# Patient Record
Sex: Male | Born: 1945 | Race: Black or African American | Hispanic: No | Smoking: Former smoker
Health system: Southern US, Community
[De-identification: ages and names within clinical notes are randomized; demographics above are authoritative.]

## PROBLEM LIST (undated history)

## (undated) DIAGNOSIS — R413 Other amnesia: Secondary | ICD-10-CM

## (undated) DIAGNOSIS — C61 Malignant neoplasm of prostate: Secondary | ICD-10-CM

## (undated) DIAGNOSIS — I77819 Aortic ectasia, unspecified site: Secondary | ICD-10-CM

## (undated) DIAGNOSIS — E0590 Thyrotoxicosis, unspecified without thyrotoxic crisis or storm: Secondary | ICD-10-CM

## (undated) DIAGNOSIS — F0390 Unspecified dementia without behavioral disturbance: Secondary | ICD-10-CM

## (undated) DIAGNOSIS — F329 Major depressive disorder, single episode, unspecified: Secondary | ICD-10-CM

## (undated) DIAGNOSIS — K5900 Constipation, unspecified: Secondary | ICD-10-CM

## (undated) DIAGNOSIS — J309 Allergic rhinitis, unspecified: Secondary | ICD-10-CM

## (undated) DIAGNOSIS — Z860100 Personal history of colon polyps, unspecified: Secondary | ICD-10-CM

## (undated) DIAGNOSIS — I10 Essential (primary) hypertension: Secondary | ICD-10-CM

## (undated) DIAGNOSIS — N200 Calculus of kidney: Secondary | ICD-10-CM

## (undated) DIAGNOSIS — E559 Vitamin D deficiency, unspecified: Secondary | ICD-10-CM

## (undated) DIAGNOSIS — K861 Other chronic pancreatitis: Secondary | ICD-10-CM

## (undated) DIAGNOSIS — T7840XA Allergy, unspecified, initial encounter: Secondary | ICD-10-CM

## (undated) DIAGNOSIS — F32A Depression, unspecified: Secondary | ICD-10-CM

## (undated) HISTORY — DX: Vitamin D deficiency, unspecified: E55.9

## (undated) HISTORY — DX: Aortic ectasia, unspecified site: I77.819

## (undated) HISTORY — PX: OTHER SURGICAL HISTORY: SHX169

## (undated) HISTORY — DX: Thyrotoxicosis, unspecified without thyrotoxic crisis or storm: E05.90

## (undated) HISTORY — DX: Unspecified dementia, unspecified severity, without behavioral disturbance, psychotic disturbance, mood disturbance, and anxiety: F03.90

## (undated) HISTORY — DX: Depression, unspecified: F32.A

## (undated) HISTORY — DX: Personal history of colonic polyps: Z86.010

## (undated) HISTORY — DX: Other amnesia: R41.3

## (undated) HISTORY — DX: Allergy, unspecified, initial encounter: T78.40XA

## (undated) HISTORY — DX: Allergic rhinitis, unspecified: J30.9

## (undated) HISTORY — DX: Constipation, unspecified: K59.00

## (undated) HISTORY — DX: Other chronic pancreatitis: K86.1

## (undated) HISTORY — DX: Major depressive disorder, single episode, unspecified: F32.9

## (undated) HISTORY — DX: Essential (primary) hypertension: I10

## (undated) HISTORY — DX: Calculus of kidney: N20.0

## (undated) HISTORY — DX: Personal history of colon polyps, unspecified: Z86.0100

## (undated) HISTORY — DX: Malignant neoplasm of prostate: C61

---

## 1988-05-18 HISTORY — PX: OTHER SURGICAL HISTORY: SHX169

## 2003-05-19 DIAGNOSIS — C61 Malignant neoplasm of prostate: Secondary | ICD-10-CM

## 2003-05-19 HISTORY — DX: Malignant neoplasm of prostate: C61

## 2003-05-19 HISTORY — PX: OTHER SURGICAL HISTORY: SHX169

## 2007-10-15 ENCOUNTER — Emergency Department (HOSPITAL_COMMUNITY): Admission: EM | Admit: 2007-10-15 | Discharge: 2007-10-15 | Payer: Self-pay | Admitting: Emergency Medicine

## 2007-10-24 ENCOUNTER — Emergency Department (HOSPITAL_COMMUNITY): Admission: EM | Admit: 2007-10-24 | Discharge: 2007-10-24 | Payer: Self-pay | Admitting: Emergency Medicine

## 2007-11-25 ENCOUNTER — Ambulatory Visit: Payer: Self-pay | Admitting: Internal Medicine

## 2007-11-25 DIAGNOSIS — Z860100 Personal history of colon polyps, unspecified: Secondary | ICD-10-CM | POA: Insufficient documentation

## 2007-11-25 DIAGNOSIS — Z8546 Personal history of malignant neoplasm of prostate: Secondary | ICD-10-CM | POA: Insufficient documentation

## 2007-11-25 DIAGNOSIS — I10 Essential (primary) hypertension: Secondary | ICD-10-CM | POA: Insufficient documentation

## 2007-11-25 DIAGNOSIS — J309 Allergic rhinitis, unspecified: Secondary | ICD-10-CM | POA: Insufficient documentation

## 2007-11-25 DIAGNOSIS — M1990 Unspecified osteoarthritis, unspecified site: Secondary | ICD-10-CM | POA: Insufficient documentation

## 2008-07-27 ENCOUNTER — Ambulatory Visit: Payer: Self-pay | Admitting: Internal Medicine

## 2008-08-13 ENCOUNTER — Ambulatory Visit: Payer: Self-pay | Admitting: Internal Medicine

## 2011-02-12 LAB — POCT CARDIAC MARKERS
CKMB, poc: 2
Myoglobin, poc: 102
Operator id: 265201
Troponin i, poc: <0.05

## 2011-02-12 LAB — CBC
MCHC: 34.2
RBC: 4.76
RDW: 14.3

## 2011-02-12 LAB — DIFFERENTIAL
Basophils Absolute: 0.1
Basophils Relative: 2 — ABNORMAL HIGH
Monocytes Relative: 4
Neutro Abs: 3.9
Neutrophils Relative %: 64

## 2011-02-12 LAB — POCT I-STAT, CHEM 8
Chloride: 105
HCT: 48
Potassium: 4.2
Sodium: 141

## 2011-05-21 DIAGNOSIS — R319 Hematuria, unspecified: Secondary | ICD-10-CM | POA: Diagnosis not present

## 2011-05-21 DIAGNOSIS — N529 Male erectile dysfunction, unspecified: Secondary | ICD-10-CM | POA: Diagnosis not present

## 2011-05-21 DIAGNOSIS — C61 Malignant neoplasm of prostate: Secondary | ICD-10-CM | POA: Diagnosis not present

## 2011-07-23 DIAGNOSIS — C61 Malignant neoplasm of prostate: Secondary | ICD-10-CM | POA: Diagnosis not present

## 2011-09-04 DIAGNOSIS — R319 Hematuria, unspecified: Secondary | ICD-10-CM | POA: Diagnosis not present

## 2011-09-04 DIAGNOSIS — N529 Male erectile dysfunction, unspecified: Secondary | ICD-10-CM | POA: Diagnosis not present

## 2011-09-04 DIAGNOSIS — C61 Malignant neoplasm of prostate: Secondary | ICD-10-CM | POA: Diagnosis not present

## 2011-09-04 DIAGNOSIS — Z8546 Personal history of malignant neoplasm of prostate: Secondary | ICD-10-CM | POA: Diagnosis not present

## 2011-09-04 DIAGNOSIS — D414 Neoplasm of uncertain behavior of bladder: Secondary | ICD-10-CM | POA: Diagnosis not present

## 2012-02-24 DIAGNOSIS — C61 Malignant neoplasm of prostate: Secondary | ICD-10-CM | POA: Diagnosis not present

## 2012-02-24 DIAGNOSIS — D414 Neoplasm of uncertain behavior of bladder: Secondary | ICD-10-CM | POA: Diagnosis not present

## 2012-06-07 DIAGNOSIS — C61 Malignant neoplasm of prostate: Secondary | ICD-10-CM | POA: Diagnosis not present

## 2012-06-14 DIAGNOSIS — D414 Neoplasm of uncertain behavior of bladder: Secondary | ICD-10-CM | POA: Diagnosis not present

## 2013-04-17 DIAGNOSIS — C61 Malignant neoplasm of prostate: Secondary | ICD-10-CM | POA: Diagnosis not present

## 2013-04-25 DIAGNOSIS — Z87440 Personal history of urinary (tract) infections: Secondary | ICD-10-CM | POA: Diagnosis not present

## 2013-04-25 DIAGNOSIS — D414 Neoplasm of uncertain behavior of bladder: Secondary | ICD-10-CM | POA: Diagnosis not present

## 2013-07-28 ENCOUNTER — Encounter: Payer: Self-pay | Admitting: Internal Medicine

## 2013-12-05 DIAGNOSIS — C61 Malignant neoplasm of prostate: Secondary | ICD-10-CM | POA: Diagnosis not present

## 2014-01-31 ENCOUNTER — Encounter: Payer: Self-pay | Admitting: Internal Medicine

## 2014-03-18 DIAGNOSIS — R410 Disorientation, unspecified: Secondary | ICD-10-CM | POA: Diagnosis not present

## 2014-03-18 DIAGNOSIS — F4489 Other dissociative and conversion disorders: Secondary | ICD-10-CM | POA: Diagnosis not present

## 2014-03-18 DIAGNOSIS — I6789 Other cerebrovascular disease: Secondary | ICD-10-CM | POA: Diagnosis not present

## 2014-03-18 DIAGNOSIS — R000 Tachycardia, unspecified: Secondary | ICD-10-CM | POA: Diagnosis not present

## 2014-03-18 DIAGNOSIS — R4182 Altered mental status, unspecified: Secondary | ICD-10-CM | POA: Diagnosis not present

## 2014-03-19 DIAGNOSIS — R4182 Altered mental status, unspecified: Secondary | ICD-10-CM | POA: Diagnosis not present

## 2014-03-21 DIAGNOSIS — R4189 Other symptoms and signs involving cognitive functions and awareness: Secondary | ICD-10-CM | POA: Diagnosis not present

## 2014-03-21 DIAGNOSIS — I10 Essential (primary) hypertension: Secondary | ICD-10-CM | POA: Diagnosis not present

## 2014-03-21 DIAGNOSIS — Z8546 Personal history of malignant neoplasm of prostate: Secondary | ICD-10-CM | POA: Diagnosis not present

## 2014-03-28 DIAGNOSIS — I10 Essential (primary) hypertension: Secondary | ICD-10-CM | POA: Diagnosis not present

## 2014-03-28 DIAGNOSIS — E538 Deficiency of other specified B group vitamins: Secondary | ICD-10-CM | POA: Diagnosis not present

## 2014-04-04 ENCOUNTER — Ambulatory Visit (HOSPITAL_COMMUNITY)
Admission: RE | Admit: 2014-04-04 | Discharge: 2014-04-04 | Disposition: A | Discharge status: Home / Self Care | Payer: Medicare Other | Source: Ambulatory Visit | Attending: Internal Medicine | Admitting: Internal Medicine

## 2014-04-04 ENCOUNTER — Encounter (HOSPITAL_COMMUNITY): Payer: Self-pay

## 2014-04-04 ENCOUNTER — Other Ambulatory Visit (HOSPITAL_COMMUNITY): Payer: Self-pay | Admitting: Internal Medicine

## 2014-04-04 ENCOUNTER — Encounter: Payer: Self-pay | Admitting: Internal Medicine

## 2014-04-04 DIAGNOSIS — G319 Degenerative disease of nervous system, unspecified: Secondary | ICD-10-CM | POA: Insufficient documentation

## 2014-04-04 DIAGNOSIS — S0990XA Unspecified injury of head, initial encounter: Secondary | ICD-10-CM | POA: Diagnosis not present

## 2014-04-04 DIAGNOSIS — Z1211 Encounter for screening for malignant neoplasm of colon: Secondary | ICD-10-CM | POA: Diagnosis not present

## 2014-04-04 DIAGNOSIS — R4189 Other symptoms and signs involving cognitive functions and awareness: Secondary | ICD-10-CM

## 2014-04-04 DIAGNOSIS — I10 Essential (primary) hypertension: Secondary | ICD-10-CM | POA: Diagnosis not present

## 2014-04-04 DIAGNOSIS — I6782 Cerebral ischemia: Secondary | ICD-10-CM | POA: Diagnosis not present

## 2014-04-04 DIAGNOSIS — R413 Other amnesia: Secondary | ICD-10-CM | POA: Insufficient documentation

## 2014-04-04 DIAGNOSIS — E538 Deficiency of other specified B group vitamins: Secondary | ICD-10-CM | POA: Diagnosis not present

## 2014-04-04 DIAGNOSIS — Z23 Encounter for immunization: Secondary | ICD-10-CM | POA: Diagnosis not present

## 2014-04-11 DIAGNOSIS — C61 Malignant neoplasm of prostate: Secondary | ICD-10-CM | POA: Diagnosis not present

## 2014-04-16 DIAGNOSIS — I10 Essential (primary) hypertension: Secondary | ICD-10-CM | POA: Diagnosis not present

## 2014-04-16 DIAGNOSIS — F4320 Adjustment disorder, unspecified: Secondary | ICD-10-CM | POA: Diagnosis not present

## 2014-05-22 ENCOUNTER — Telehealth: Payer: Self-pay | Admitting: Neurology

## 2014-05-22 DIAGNOSIS — H3561 Retinal hemorrhage, right eye: Secondary | ICD-10-CM | POA: Diagnosis not present

## 2014-05-22 DIAGNOSIS — H04123 Dry eye syndrome of bilateral lacrimal glands: Secondary | ICD-10-CM | POA: Diagnosis not present

## 2014-05-22 NOTE — Telephone Encounter (Signed)
Pt r/s his NP appt w/ Dr. Delice Lesch from 05/24/14 to 06/22/14. DR. Shamleffer/referring provider was notified.

## 2014-05-24 ENCOUNTER — Ambulatory Visit: Payer: Medicare Other | Admitting: Neurology

## 2014-05-25 ENCOUNTER — Ambulatory Visit (AMBULATORY_SURGERY_CENTER): Payer: Self-pay | Admitting: *Deleted

## 2014-05-25 VITALS — Ht 70.0 in | Wt 203.0 lb

## 2014-05-25 DIAGNOSIS — Z86010 Personal history of colonic polyps: Secondary | ICD-10-CM

## 2014-05-25 MED ORDER — MOVIPREP 100 G PO SOLR: ORAL | Status: DC

## 2014-05-25 NOTE — Progress Notes (Signed)
No allergies to eggs or soy. No problems with anesthesia.  Pt given Emmi instructions for colonoscopy  No oxygen use  No diet drug use  Pt is beginning to have memory issues.  During PV; pt had difficulty giving some of his medical history.  I talked to pt's daughter Lorcan Shelp over the phone and she gave medical hx and med.  List.  Instructions were given to pt for prep and copy printed for daughter.  She says that she will be with pt day before procedure to help with prep.  Pt to sign consent day of procedure with daughter present.  Pt does not have a medical POA yet.

## 2014-05-25 NOTE — Progress Notes (Deleted)
No allergies to eggs or soy. No problems with anesthesia.  Pt given Emmi instructions for colonoscopy  No oxygen use  No diet drug use  Pt is beginning to have memory issues.  During PV; pt had difficulty giving some of his medical history.  I talked to pt's daughter Armond Cuthrell over the phone and she gave medical hx and med.  List.  Instructions were given to pt for prep and copy printed for daughter.  She says that she will be with pt day before procedure to help with prep.  Pt to sign consent day of procedure with daughter present.  Pt does not have a medical POA yet.

## 2014-06-08 ENCOUNTER — Encounter: Payer: Medicare Other | Admitting: Internal Medicine

## 2014-06-13 ENCOUNTER — Ambulatory Visit (AMBULATORY_SURGERY_CENTER): Payer: Medicare Other | Admitting: Internal Medicine

## 2014-06-13 ENCOUNTER — Encounter: Payer: Self-pay | Admitting: Internal Medicine

## 2014-06-13 VITALS — BP 134/68 | HR 60 | Temp 97.6°F | Resp 16 | Ht 70.0 in | Wt 203.0 lb

## 2014-06-13 DIAGNOSIS — I10 Essential (primary) hypertension: Secondary | ICD-10-CM | POA: Diagnosis not present

## 2014-06-13 DIAGNOSIS — Z86010 Personal history of colonic polyps: Secondary | ICD-10-CM

## 2014-06-13 DIAGNOSIS — Z1211 Encounter for screening for malignant neoplasm of colon: Secondary | ICD-10-CM | POA: Diagnosis not present

## 2014-06-13 MED ORDER — SODIUM CHLORIDE 0.9 % IV SOLN: 500.0000 mL | INTRAVENOUS | Status: DC

## 2014-06-13 NOTE — Op Note (Signed)
[facility]  [address] [place], [id number]   COLONOSCOPY PROCEDURE REPORT  PATIENT: Samuel Barry, Samuel Barry  MR#: [id number] BIRTHDATE: May 01, 1946 , 68  yrs. old GENDER: male ENDOSCOPIST: Lafayette Dragon, MD REFERRED XB:JYNW Avel Sensor, M.D. PROCEDURE DATE:  06/13/2014 PROCEDURE:   Colonoscopy, screening First Screening Colonoscopy - Avg.  risk and is 50 yrs.  old or older - No.  Prior Negative Screening - Now for repeat screening. N/A Prior Negative Screening - Now for repeat screening.  Less than 10 yrs Prior Negative Screening - Now for repeat screening.  Above average risk  History of Adenoma - Now for follow-up colonoscopy  has been > or = to 3 yrs.  Yes hx of adenoma.  Has been 3 or more years since last colonoscopy.  Polyps Removed Today? No.  Polyps Removed Today? No.  Recommend repeat exam, <10 yrs? Polyps Removed Today? No.  Recommend repeat exam, <10 yrs? Yes.  Polyps Removed Today? No.  Recommend repeat exam, <10 yrs? Yes.  High risk (family or personal hx). ASA CLASS:   Class II INDICATIONS:positive family history of colon cancer in a direct relatives.  Adenomatous polyp in 2005.  Last colonoscopy March 2010 showed diverticulosis and hemorrhoids. MEDICATIONS: Monitored anesthesia care and Propofol 150 mg IV  DESCRIPTION OF PROCEDURE:   After the risks benefits and alternatives of the procedure were thoroughly explained, informed consent was obtained.  The digital rectal exam revealed no abnormalities of the rectum.   The LB [id number]  endoscope was introduced through the anus and advanced to the cecum, which was identified by both the appendix and ileocecal valve. No adverse events experienced.   The quality of the prep was excellent, using MoviPrep  The instrument was then slowly withdrawn as the colon was fully examined.      COLON FINDINGS: There was moderate diverticulosis noted in the sigmoid colon with associated angulation and muscular  hypertrophy. Small internal Grade I hemorrhoids were found.  Retroflexed views revealed no abnormalities. The time to cecum=5 minutes 37 seconds. Withdrawal time=6 minutes 22 seconds.  The scope was withdrawn and the procedure completed. COMPLICATIONS: There were no immediate complications.  ENDOSCOPIC IMPRESSION: 1.   There was moderate diverticulosis noted in the sigmoid colon 2.   Small internal Grade I hemorrhoids  RECOMMENDATIONS: High fiber diet Recall colonoscopy in 5 years  eSigned:  Lafayette Dragon, MD 06/13/2014 9:58 AM   cc:   PATIENT NAME:  Dontravious, Camille MR#: [id number]

## 2014-06-13 NOTE — Patient Instructions (Signed)
YOU HAD AN ENDOSCOPIC PROCEDURE TODAY AT THE [facility]: Refer to the procedure report that was given to you for any specific questions about what was found during the examination.  If the procedure report does not answer your questions, please call your gastroenterologist to clarify.  If you requested that your care partner not be given the details of your procedure findings, then the procedure report has been included in a sealed envelope for you to review at your convenience later.  YOU SHOULD EXPECT: Some feelings of bloating in the abdomen. Passage of more gas than usual.  Walking can help get rid of the air that was put into your GI tract during the procedure and reduce the bloating. If you had a lower endoscopy (such as a colonoscopy or flexible sigmoidoscopy) you may notice spotting of blood in your stool or on the toilet paper. If you underwent a bowel prep for your procedure, then you may not have a normal bowel movement for a few days.  DIET: Your first meal following the procedure should be a light meal and then it is ok to progress to your normal diet.  A half-sandwich or bowl of soup is an example of a good first meal.  Heavy or fried foods are harder to digest and may make you feel nauseous or bloated.  Likewise meals heavy in dairy and vegetables can cause extra gas to form and this can also increase the bloating.  Drink plenty of fluids but you should avoid alcoholic beverages for 24 hours.  ACTIVITY: Your care partner should take you home directly after the procedure.  You should plan to take it easy, moving slowly for the rest of the day.  You can resume normal activity the day after the procedure however you should NOT DRIVE or use heavy machinery for 24 hours (because of the sedation medicines used during the test).    SYMPTOMS TO REPORT IMMEDIATELY: A gastroenterologist can be reached at any hour.  During normal business hours, 8:30 AM to 5:00 PM Monday through Friday,  call [phone].  After hours and on weekends, please call the GI answering service at [phone] who will take a message and have the physician on call contact you.   Following lower endoscopy (colonoscopy or flexible sigmoidoscopy):  Excessive amounts of blood in the stool  Significant tenderness or worsening of abdominal pains  Swelling of the abdomen that is new, acute  Fever of 100F or higher    FOLLOW UP: If any biopsies were taken you will be contacted by phone or by letter within the next 1-3 weeks.  Call your gastroenterologist if you have not heard about the biopsies in 3 weeks.  Our staff will call the home number listed on your records the next business day following your procedure to check on you and address any questions or concerns that you may have at that time regarding the information given to you following your procedure. This is a courtesy call and so if there is no answer at the home number and we have not heard from you through the emergency physician on call, we will assume that you have returned to your regular daily activities without incident.  SIGNATURES/CONFIDENTIALITY: You and/or your care partner have signed paperwork which will be entered into your electronic medical record.  These signatures attest to the fact that that the information above on your After Visit Summary has been reviewed and is understood.  Full responsibility of the confidentiality   of this discharge information lies with you and/or your care-partner.     

## 2014-06-13 NOTE — Progress Notes (Signed)
A/ox3 pleased with MAC, report to Karen RN 

## 2014-06-14 ENCOUNTER — Telehealth: Payer: Self-pay

## 2014-06-14 NOTE — Telephone Encounter (Signed)
  Follow up Call-  Call back number 06/13/2014  Post procedure Call Back phone  # [phone]  Permission to leave phone message Yes     Patient questions:  Do you have a fever, pain , or abdominal swelling? No. Pain Score  0 *  Have you tolerated food without any problems? Yes.    Have you been able to return to your normal activities? Yes.    Do you have any questions about your discharge instructions: Diet   No. Medications  No. Follow up visit  No.  Do you have questions or concerns about your Care? No.  Actions: * If pain score is 4 or above: No action needed, pain <4.

## 2014-06-22 ENCOUNTER — Encounter: Payer: Self-pay | Admitting: Neurology

## 2014-06-22 ENCOUNTER — Ambulatory Visit (INDEPENDENT_AMBULATORY_CARE_PROVIDER_SITE_OTHER): Payer: Medicare Other | Admitting: Neurology

## 2014-06-22 VITALS — BP 130/102 | HR 84 | Resp 16 | Ht 70.0 in | Wt 203.0 lb

## 2014-06-22 DIAGNOSIS — Z8546 Personal history of malignant neoplasm of prostate: Secondary | ICD-10-CM | POA: Diagnosis not present

## 2014-06-22 DIAGNOSIS — R413 Other amnesia: Secondary | ICD-10-CM | POA: Diagnosis not present

## 2014-06-22 LAB — BUN: BUN: 23 mg/dL (ref 6–23)

## 2014-06-22 LAB — CREATININE, SERUM: Creat: 1.39 mg/dL — ABNORMAL HIGH (ref 0.50–1.35)

## 2014-06-22 MED ORDER — DONEPEZIL HCL 10 MG PO TABS: ORAL_TABLET | ORAL | Status: DC

## 2014-06-22 NOTE — Patient Instructions (Signed)
1. Schedule MRI brain with and without contrast 2. Start Aricept 10mg : Take 1/2 tablet daily for 1 week, then increase to 1 tablet daily 3. Physical exercise and brain stimulation exercises, control of BP, are important for brain health 4. Continue to monitor driving and home safety
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
## 2014-06-25 ENCOUNTER — Encounter: Payer: Self-pay | Admitting: Neurology
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
## 2014-07-03 ENCOUNTER — Ambulatory Visit (HOSPITAL_COMMUNITY): Admission: RE | Admit: 2014-07-03 | Payer: Medicare Other | Source: Ambulatory Visit

## 2014-07-13 ENCOUNTER — Ambulatory Visit (HOSPITAL_COMMUNITY): Payer: No Typology Code available for payment source

## 2014-07-17 DIAGNOSIS — C61 Malignant neoplasm of prostate: Secondary | ICD-10-CM | POA: Diagnosis not present

## 2014-07-19 ENCOUNTER — Ambulatory Visit (HOSPITAL_COMMUNITY)
Admission: RE | Admit: 2014-07-19 | Discharge: 2014-07-19 | Disposition: A | Discharge status: Home / Self Care | Payer: Medicare Other | Source: Ambulatory Visit | Attending: Neurology | Admitting: Neurology

## 2014-07-19 DIAGNOSIS — C61 Malignant neoplasm of prostate: Secondary | ICD-10-CM | POA: Diagnosis not present

## 2014-07-19 DIAGNOSIS — R413 Other amnesia: Secondary | ICD-10-CM | POA: Diagnosis not present

## 2014-07-19 DIAGNOSIS — R200 Anesthesia of skin: Secondary | ICD-10-CM | POA: Insufficient documentation

## 2014-07-19 DIAGNOSIS — R42 Dizziness and giddiness: Secondary | ICD-10-CM | POA: Diagnosis not present

## 2014-07-19 DIAGNOSIS — R4182 Altered mental status, unspecified: Secondary | ICD-10-CM | POA: Insufficient documentation

## 2014-07-19 MED ORDER — GADOBENATE DIMEGLUMINE 529 MG/ML IV SOLN
20.0000 mL | Freq: Once | INTRAVENOUS | Status: AC | PRN
  Administered 2014-07-19: 20 mL via INTRAVENOUS

## 2014-07-23 ENCOUNTER — Telehealth: Payer: Self-pay | Admitting: Neurology

## 2014-07-23 NOTE — Telephone Encounter (Signed)
Discussed MRI findings with son, showing amyloid angiopathy. Son feels like the Aricept is helping, went to [employer] [facility] [employer] without difficulty. Seems like a different person now because he could not remember basic things, but the other day, son was surprised he was more clear. Feels he is better. Discussed continued BP control, continue Aricept.

## 2014-07-24 DIAGNOSIS — C61 Malignant neoplasm of prostate: Secondary | ICD-10-CM | POA: Diagnosis not present

## 2014-07-31 DIAGNOSIS — H3561 Retinal hemorrhage, right eye: Secondary | ICD-10-CM | POA: Diagnosis not present

## 2014-07-31 DIAGNOSIS — H04123 Dry eye syndrome of bilateral lacrimal glands: Secondary | ICD-10-CM | POA: Diagnosis not present

## 2014-07-31 DIAGNOSIS — H269 Unspecified cataract: Secondary | ICD-10-CM | POA: Diagnosis not present

## 2014-08-29 DIAGNOSIS — C61 Malignant neoplasm of prostate: Secondary | ICD-10-CM | POA: Diagnosis not present

## 2014-09-10 DIAGNOSIS — A630 Anogenital (venereal) warts: Secondary | ICD-10-CM | POA: Diagnosis not present

## 2014-09-10 DIAGNOSIS — T83490A Other mechanical complication of penile (implanted) prosthesis, initial encounter: Secondary | ICD-10-CM | POA: Diagnosis not present

## 2014-09-10 DIAGNOSIS — C61 Malignant neoplasm of prostate: Secondary | ICD-10-CM | POA: Diagnosis not present

## 2014-09-10 DIAGNOSIS — N509 Disorder of male genital organs, unspecified: Secondary | ICD-10-CM | POA: Diagnosis not present

## 2014-09-10 DIAGNOSIS — N529 Male erectile dysfunction, unspecified: Secondary | ICD-10-CM | POA: Diagnosis not present

## 2014-09-20 ENCOUNTER — Ambulatory Visit: Payer: Medicare Other | Admitting: Neurology

## 2014-09-20 DIAGNOSIS — Z029 Encounter for administrative examinations, unspecified: Secondary | ICD-10-CM

## 2014-09-21 ENCOUNTER — Encounter: Payer: Self-pay | Admitting: Neurology

## 2014-09-25 DIAGNOSIS — Z79899 Other long term (current) drug therapy: Secondary | ICD-10-CM | POA: Diagnosis not present

## 2014-09-25 DIAGNOSIS — I10 Essential (primary) hypertension: Secondary | ICD-10-CM | POA: Diagnosis not present

## 2014-09-25 DIAGNOSIS — T83490A Other mechanical complication of penile (implanted) prosthesis, initial encounter: Secondary | ICD-10-CM | POA: Diagnosis not present

## 2014-09-25 DIAGNOSIS — Z8546 Personal history of malignant neoplasm of prostate: Secondary | ICD-10-CM | POA: Diagnosis not present

## 2014-09-27 ENCOUNTER — Telehealth: Payer: Self-pay | Admitting: Neurology

## 2014-09-27 NOTE — Telephone Encounter (Signed)
I returned call to [place]. She states that the Aricept is too expensive the patient is paying approx $200 a month & she also states that they haven't seen any change since patient has been on the medication & he seems to be getting worse. I did verify with her that patient had been getting the generic she did say he was getting donepezil. I did advise her to call Medicare to see what his options were for getting a Rx plan. Looking at patient's ins information in the system, I explained to her that it looks like he doesn't have any type of Rx plan ins coverage & that's why his Rx has been that much. I also notated that Dr. Delice Lesch had spoken with the patient's son/her brother on March 7th & he told Dr. Delice Lesch that the patient was doing better on the med & seemed to be more clear headed, he stated he seemed like a different person now that he was on the medication. Dr. Amparo Bristol advisement was for patient to stay on the Aricept. I also asked her about patient getting the DMV driving evaluation that they were advised to do to determine if the patient was able to continue drive, she stated that they haven't done so yet because they feel that's the only thing that he has left & if they take that away from him he would "just die". She states that she was unaware of the conversation that her brother had with Dr. Delice Lesch in March about patient doing better on the medication. She asked me to hold while she called her brother on [address] so I could discuss the conversation he had with Dr. Delice Lesch while she was on the phone. I did explain to her that I couldn't do that & I  couldn't get in the middle of the back & forth with the family that she would have to discuss this with her brother herself. She tried for a 2nd time to put me on hold so she can get her brother on the line with [place]. I explained again that I wouldn't be able to do that and at that time she got upset and hung up in my face.

## 2014-09-27 NOTE — Telephone Encounter (Signed)
Pt daughter Samuel Barry called and wants to talk to you about medication please call [phone] DON NOT CALL PATIENT PER DAUGHTER

## 2014-09-28 NOTE — Telephone Encounter (Signed)
Noted, thanks!

## 2014-10-01 DIAGNOSIS — Z8546 Personal history of malignant neoplasm of prostate: Secondary | ICD-10-CM | POA: Diagnosis not present

## 2014-10-01 DIAGNOSIS — I10 Essential (primary) hypertension: Secondary | ICD-10-CM | POA: Diagnosis not present

## 2014-10-01 DIAGNOSIS — T83490A Other mechanical complication of penile (implanted) prosthesis, initial encounter: Secondary | ICD-10-CM | POA: Diagnosis not present

## 2014-10-01 DIAGNOSIS — Z79899 Other long term (current) drug therapy: Secondary | ICD-10-CM | POA: Diagnosis not present

## 2014-10-02 DIAGNOSIS — T83490A Other mechanical complication of penile (implanted) prosthesis, initial encounter: Secondary | ICD-10-CM | POA: Diagnosis not present

## 2014-10-02 DIAGNOSIS — I10 Essential (primary) hypertension: Secondary | ICD-10-CM | POA: Diagnosis not present

## 2014-10-02 DIAGNOSIS — Z8546 Personal history of malignant neoplasm of prostate: Secondary | ICD-10-CM | POA: Diagnosis not present

## 2014-10-02 DIAGNOSIS — Z79899 Other long term (current) drug therapy: Secondary | ICD-10-CM | POA: Diagnosis not present

## 2014-10-08 DIAGNOSIS — C61 Malignant neoplasm of prostate: Secondary | ICD-10-CM | POA: Diagnosis not present

## 2014-10-23 DIAGNOSIS — C61 Malignant neoplasm of prostate: Secondary | ICD-10-CM | POA: Diagnosis not present

## 2014-11-16 DIAGNOSIS — C61 Malignant neoplasm of prostate: Secondary | ICD-10-CM | POA: Diagnosis not present

## 2014-12-07 ENCOUNTER — Encounter: Payer: Self-pay | Admitting: Internal Medicine

## 2014-12-17 DIAGNOSIS — C61 Malignant neoplasm of prostate: Secondary | ICD-10-CM | POA: Diagnosis not present

## 2015-04-09 DIAGNOSIS — C61 Malignant neoplasm of prostate: Secondary | ICD-10-CM | POA: Diagnosis not present

## 2015-04-18 DIAGNOSIS — C61 Malignant neoplasm of prostate: Secondary | ICD-10-CM | POA: Diagnosis not present

## 2015-06-17 DIAGNOSIS — R413 Other amnesia: Secondary | ICD-10-CM | POA: Diagnosis not present

## 2015-06-17 DIAGNOSIS — E538 Deficiency of other specified B group vitamins: Secondary | ICD-10-CM | POA: Diagnosis not present

## 2015-06-17 DIAGNOSIS — Z23 Encounter for immunization: Secondary | ICD-10-CM | POA: Diagnosis not present

## 2015-06-17 DIAGNOSIS — C61 Malignant neoplasm of prostate: Secondary | ICD-10-CM | POA: Diagnosis not present

## 2015-06-17 DIAGNOSIS — R5383 Other fatigue: Secondary | ICD-10-CM | POA: Diagnosis not present

## 2015-06-17 DIAGNOSIS — I10 Essential (primary) hypertension: Secondary | ICD-10-CM | POA: Diagnosis not present
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
## 2015-10-04 DIAGNOSIS — Z0000 Encounter for general adult medical examination without abnormal findings: Secondary | ICD-10-CM | POA: Diagnosis not present

## 2015-10-04 DIAGNOSIS — R413 Other amnesia: Secondary | ICD-10-CM | POA: Diagnosis not present

## 2015-10-04 DIAGNOSIS — E538 Deficiency of other specified B group vitamins: Secondary | ICD-10-CM | POA: Diagnosis not present

## 2015-10-04 DIAGNOSIS — Z1389 Encounter for screening for other disorder: Secondary | ICD-10-CM | POA: Diagnosis not present

## 2015-10-04 DIAGNOSIS — I10 Essential (primary) hypertension: Secondary | ICD-10-CM | POA: Diagnosis not present

## 2015-10-04 DIAGNOSIS — C61 Malignant neoplasm of prostate: Secondary | ICD-10-CM | POA: Diagnosis not present

## 2015-10-24 DIAGNOSIS — R413 Other amnesia: Secondary | ICD-10-CM | POA: Diagnosis not present

## 2015-10-24 DIAGNOSIS — F329 Major depressive disorder, single episode, unspecified: Secondary | ICD-10-CM | POA: Diagnosis not present

## 2015-10-24 DIAGNOSIS — K5900 Constipation, unspecified: Secondary | ICD-10-CM | POA: Diagnosis not present

## 2015-12-06 DIAGNOSIS — C61 Malignant neoplasm of prostate: Secondary | ICD-10-CM | POA: Diagnosis not present

## 2016-02-06 DIAGNOSIS — Z23 Encounter for immunization: Secondary | ICD-10-CM | POA: Diagnosis not present

## 2016-02-06 DIAGNOSIS — I10 Essential (primary) hypertension: Secondary | ICD-10-CM | POA: Diagnosis not present

## 2016-02-06 DIAGNOSIS — F3341 Major depressive disorder, recurrent, in partial remission: Secondary | ICD-10-CM | POA: Diagnosis not present

## 2016-02-06 DIAGNOSIS — R413 Other amnesia: Secondary | ICD-10-CM | POA: Diagnosis not present

## 2016-02-10 DIAGNOSIS — R8271 Bacteriuria: Secondary | ICD-10-CM | POA: Diagnosis not present

## 2016-02-10 DIAGNOSIS — C61 Malignant neoplasm of prostate: Secondary | ICD-10-CM | POA: Diagnosis not present

## 2016-05-19 DIAGNOSIS — H2513 Age-related nuclear cataract, bilateral: Secondary | ICD-10-CM | POA: Diagnosis not present

## 2016-06-26 DIAGNOSIS — F3341 Major depressive disorder, recurrent, in partial remission: Secondary | ICD-10-CM | POA: Diagnosis not present

## 2016-06-26 DIAGNOSIS — I10 Essential (primary) hypertension: Secondary | ICD-10-CM | POA: Diagnosis not present

## 2016-06-26 DIAGNOSIS — R413 Other amnesia: Secondary | ICD-10-CM | POA: Diagnosis not present

## 2016-07-08 DIAGNOSIS — H25043 Posterior subcapsular polar age-related cataract, bilateral: Secondary | ICD-10-CM | POA: Diagnosis not present

## 2016-07-08 DIAGNOSIS — H2512 Age-related nuclear cataract, left eye: Secondary | ICD-10-CM | POA: Diagnosis not present

## 2016-07-08 DIAGNOSIS — H25013 Cortical age-related cataract, bilateral: Secondary | ICD-10-CM | POA: Diagnosis not present

## 2016-07-08 DIAGNOSIS — H2513 Age-related nuclear cataract, bilateral: Secondary | ICD-10-CM | POA: Diagnosis not present

## 2016-08-04 DIAGNOSIS — H2512 Age-related nuclear cataract, left eye: Secondary | ICD-10-CM | POA: Diagnosis not present

## 2016-08-11 DIAGNOSIS — H2511 Age-related nuclear cataract, right eye: Secondary | ICD-10-CM | POA: Diagnosis not present

## 2016-12-25 DIAGNOSIS — R413 Other amnesia: Secondary | ICD-10-CM | POA: Diagnosis not present

## 2016-12-25 DIAGNOSIS — F3341 Major depressive disorder, recurrent, in partial remission: Secondary | ICD-10-CM | POA: Diagnosis not present

## 2016-12-25 DIAGNOSIS — I10 Essential (primary) hypertension: Secondary | ICD-10-CM | POA: Diagnosis not present

## 2017-07-13 DIAGNOSIS — R8290 Unspecified abnormal findings in urine: Secondary | ICD-10-CM | POA: Diagnosis not present

## 2017-07-13 DIAGNOSIS — N529 Male erectile dysfunction, unspecified: Secondary | ICD-10-CM | POA: Diagnosis not present

## 2017-07-13 DIAGNOSIS — C61 Malignant neoplasm of prostate: Secondary | ICD-10-CM | POA: Diagnosis not present

## 2017-08-05 ENCOUNTER — Encounter (HOSPITAL_COMMUNITY): Payer: Self-pay | Admitting: Family Medicine

## 2017-08-05 ENCOUNTER — Ambulatory Visit (INDEPENDENT_AMBULATORY_CARE_PROVIDER_SITE_OTHER): Payer: Medicare Other

## 2017-08-05 ENCOUNTER — Ambulatory Visit (HOSPITAL_COMMUNITY)
Admission: EM | Admit: 2017-08-05 | Discharge: 2017-08-05 | Disposition: A | Discharge status: Home / Self Care | Payer: Medicare Other | Attending: Family Medicine | Admitting: Family Medicine

## 2017-08-05 DIAGNOSIS — S52502A Unspecified fracture of the lower end of left radius, initial encounter for closed fracture: Secondary | ICD-10-CM

## 2017-08-05 DIAGNOSIS — W19XXXA Unspecified fall, initial encounter: Secondary | ICD-10-CM

## 2017-08-05 DIAGNOSIS — S52572A Other intraarticular fracture of lower end of left radius, initial encounter for closed fracture: Secondary | ICD-10-CM | POA: Diagnosis not present

## 2017-08-05 MED ORDER — HYDROCODONE-ACETAMINOPHEN 5-325 MG PO TABS: 0.5000 | ORAL_TABLET | ORAL | 0 refills | Status: DC | PRN

## 2017-08-05 NOTE — Progress Notes (Signed)
Orthopedic Tech Progress Note Patient Details:  Samuel Barry May 08, 1946 [id number]  Ortho Devices Type of Ortho Device: Ace wrap, Volar splint Ortho Device/Splint Location: LUE Ortho Device/Splint Interventions: Ordered, Application   Post Interventions Patient Tolerated: Well Instructions Provided: Care of device   Braulio Bosch 08/05/2017, 8:44 PM

## 2017-08-05 NOTE — ED Provider Notes (Signed)
[place]    CSN: [id number] Arrival date & time: 08/05/17  1923     History   Chief Complaint Chief Complaint  Patient presents with  . Wrist Pain    HPI Samuel Barry is a 72 y.o. male.   72 year old male, with history of hypertension, depression, prostate cancer, presenting today with left wrist pain.  Patient states that he tripped and fell in the garage and caught himself on his left wrist.  Pain and swelling to the affected joints at that time.  No other injuries or complaints.  The history is provided by the patient.  Wrist Pain  This is a new problem. The current episode started 1 to 2 hours ago. The problem occurs constantly. The problem has not changed since onset.Pertinent negatives include no chest pain, no abdominal pain, no headaches and no shortness of breath. Exacerbated by: movement of the left wrist  Nothing relieves the symptoms. He has tried nothing for the symptoms. The treatment provided no relief.    Past Medical History:  Diagnosis Date  . Allergy   . Depression   . Hypertension   . Kidney stones   . Memory loss   . Prostate cancer [facility]) 2005    Patient Active Problem List   Diagnosis Date Noted  . HYPERTENSION 11/25/2007  . ALLERGIC RHINITIS 11/25/2007  . OSTEOARTHRITIS 11/25/2007  . PROSTATE CANCER, HX OF 11/25/2007  . COLONIC POLYPS, HX OF 11/25/2007    Past Surgical History:  Procedure Laterality Date  . kidney stones  1990  . surgery for prostate cancer  2005       Home Medications    Prior to Admission medications   Medication Sig Start Date End Date Taking? Authorizing Provider  amLODipine (NORVASC) 5 MG tablet Take 5 mg by mouth daily.  04/16/14   [provider]  donepezil (ARICEPT) 10 MG tablet Take 1/2 tablet daily for 1 week, then increase to 1 tablet daily 06/22/14   Cameron Sprang, MD  hydrochlorothiazide (HYDRODIURIL) 25 MG tablet Take 25 mg by mouth daily. 03/28/14   [provider]    HYDROcodone-acetaminophen (NORCO/VICODIN) 5-325 MG tablet Take 0.5-1 tablets by mouth every 4 (four) hours as needed. 08/05/17   Marry Kusch C, PA-C  tadalafil (CIALIS) 10 MG tablet 1 tablet    [provider]    Family History Family History  Problem Relation Age of Onset  . Colon cancer Mother 4  . Stomach cancer Neg Hx     Social History Social History   Tobacco Use  . Smoking status: Former Smoker    Last attempt to quit: 05/19/1963    Years since quitting: 54.2  . Smokeless tobacco: Never Used  Substance Use Topics  . Alcohol use: Yes    Alcohol/week: 0.0 oz    Comment: Social  . Drug use: No     Allergies   Doxycycline monohydrate; Sulfamethoxazole-trimethoprim; and Penicillin g   Review of Systems Review of Systems  Constitutional: Negative for chills and fever.  HENT: Negative for ear pain and sore throat.   Eyes: Negative for pain and visual disturbance.  Respiratory: Negative for cough and shortness of breath.   Cardiovascular: Negative for chest pain and palpitations.  Gastrointestinal: Negative for abdominal pain and vomiting.  Genitourinary: Negative for dysuria and hematuria.  Musculoskeletal: Positive for joint swelling (left wrist ). Negative for arthralgias and back pain.  Skin: Negative for color change and rash.  Neurological: Negative for seizures, syncope  and headaches.  All other systems reviewed and are negative.    Physical Exam Triage Vital Signs ED Triage Vitals  Enc Vitals Group     BP 08/05/17 2000 124/64     Pulse Rate 08/05/17 2000 100     Resp 08/05/17 2000 18     Temp 08/05/17 2000 98.1 F (36.7 C)     Temp src --      SpO2 08/05/17 2000 98 %     Weight --      Height --      Head Circumference --      Peak Flow --      Pain Score 08/05/17 1959 5     Pain Loc --      Pain Edu? --      Excl. in [place]? --    No data found.  Updated Vital Signs BP 124/64   Pulse 100   Temp 98.1 F (36.7 C)   Resp 18   SpO2  98%   Visual Acuity Right Eye Distance:   Left Eye Distance:   Bilateral Distance:    Right Eye Near:   Left Eye Near:    Bilateral Near:     Physical Exam  Constitutional: He appears well-developed and well-nourished.  HENT:  Head: Normocephalic and atraumatic.  Eyes: Conjunctivae are normal.  Neck: Neck supple.  Cardiovascular: Normal rate and regular rhythm.  No murmur heard. Pulmonary/Chest: Effort normal and breath sounds normal. No respiratory distress.  Abdominal: Soft. There is no tenderness.  Musculoskeletal: He exhibits no edema.       Left wrist: He exhibits decreased range of motion, tenderness and swelling.  Good radial pulse    Neurological: He is alert.  Skin: Skin is warm and dry.  Psychiatric: He has a normal mood and affect.  Nursing note and vitals reviewed.    UC Treatments / Results  Labs (all labs ordered are listed, but only abnormal results are displayed) Labs Reviewed - No data to display  EKG  EKG Interpretation None       Radiology No results found.  Procedures Procedures (including critical care time)  Medications Ordered in UC Medications - No data to display   Initial Impression / Assessment and Plan / UC Course  I have reviewed the triage vital signs and the nursing notes.  Pertinent labs & imaging results that were available during my care of the patient were reviewed by me and considered in my medical decision making (see chart for details).     X-ray shows distal radius fracture.  Patient placed in a radial gutter splint and given outpatient orthopedic referral.  Recommended rest, ice and elevation.  Final Clinical Impressions(s) / UC Diagnoses   Final diagnoses:  Closed fracture of distal end of left radius, unspecified fracture morphology, initial encounter    ED Discharge Orders        Ordered    HYDROcodone-acetaminophen (NORCO/VICODIN) 5-325 MG tablet  Every 4 hours PRN     08/05/17 2028        Controlled Substance Prescriptions [place] Controlled Substance Registry consulted? Yes, I have consulted the [place] Controlled Substances Registry for this patient, and feel the risk/benefit ratio today is favorable for proceeding with this prescription for a controlled substance.   Phebe Colla, [place] 08/05/17 2030

## 2017-08-05 NOTE — ED Triage Notes (Signed)
Pt here for fall and left wrist injury.

## 2017-08-09 ENCOUNTER — Ambulatory Visit
Admission: RE | Admit: 2017-08-09 | Discharge: 2017-08-09 | Disposition: A | Discharge status: Home / Self Care | Payer: Medicare Other | Source: Ambulatory Visit | Attending: Orthopedic Surgery | Admitting: Orthopedic Surgery

## 2017-08-09 ENCOUNTER — Encounter (INDEPENDENT_AMBULATORY_CARE_PROVIDER_SITE_OTHER): Payer: Self-pay | Admitting: Orthopedic Surgery

## 2017-08-09 ENCOUNTER — Ambulatory Visit (INDEPENDENT_AMBULATORY_CARE_PROVIDER_SITE_OTHER): Payer: Medicare Other | Admitting: Orthopedic Surgery

## 2017-08-09 DIAGNOSIS — S62102A Fracture of unspecified carpal bone, left wrist, initial encounter for closed fracture: Secondary | ICD-10-CM

## 2017-08-09 DIAGNOSIS — S52502A Unspecified fracture of the lower end of left radius, initial encounter for closed fracture: Secondary | ICD-10-CM | POA: Diagnosis not present

## 2017-08-09 NOTE — Progress Notes (Signed)
Office Visit Note   Patient: Samuel Barry           Date of Birth: August 19, 1945           MRN: [id number] Visit Date: 08/09/2017 Requested by: Samuel Dawson, MD [place]  [address], [place] [address] PCP: Samuel Dawson, MD  Subjective: Chief Complaint  Patient presents with  . Left Wrist - Injury    HPI: Samuel Barry is a patient with left wrist pain.  Date of injury 08/05/2017.  He fell off a ladder while trying to fix the barn door.  He is right-hand dominant.  Went to the emergency room and placed in a splint.  Outside radiographs are reviewed and it does show what appears to be widening of the distal radial ulnar joint.  Also intra-articular distal radius fracture is observed.  He worked at [employer] for 40 years.              ROS: All systems reviewed are negative as they relate to the chief complaint within the history of present illness.  Patient denies  fevers or chills.   Assessment & Plan: Visit Diagnoses:  1. Closed fracture of left wrist, initial encounter     Plan: Impression is left distal radius fracture with evidence of distal radial ulnar joint injury.  Plan is CT scan to evaluate the nature of the fracture as well as the possible preoperative planning for distal radial ulnar joint fixation.  I will see him back after that study.  Removable wrist splint provided today.  No lifting with that left arm.  Follow-Up Instructions: No follow-ups on file.   Orders:  Orders Placed This Encounter  Procedures  . CT WRIST LEFT WO CONTRAST   No orders of the defined types were placed in this encounter.     Procedures: No procedures performed   Clinical Data: No additional findings.  Objective: Vital Signs: There were no vitals taken for this visit.  Physical Exam:   Constitutional: Patient appears well-developed HEENT:  Head: Normocephalic Eyes:EOM are normal Neck: Normal range of motion Cardiovascular: Normal rate Pulmonary/chest:  Effort normal Neurologic: Patient is alert Skin: Skin is warm Psychiatric: Patient has normal mood and affect    Ortho Exam: Orthopedic exam demonstrates swelling around the left wrist.  Radial pulses intact.  EPL FPL interosseous function is intact.  No masses lymphadenopathy or skin changes noted in that left wrist region.  He does have intact EPL FPL interosseous function.  He also has some asymmetry regarding prominence of that distal ulna with pronation and supination left versus right.  Specialty Comments:  No specialty comments available.  Imaging: No results found.   PMFS History: Patient Active Problem List   Diagnosis Date Noted  . HYPERTENSION 11/25/2007  . ALLERGIC RHINITIS 11/25/2007  . OSTEOARTHRITIS 11/25/2007  . PROSTATE CANCER, HX OF 11/25/2007  . COLONIC POLYPS, HX OF 11/25/2007   Past Medical History:  Diagnosis Date  . Allergy   . Depression   . Hypertension   . Kidney stones   . Memory loss   . Prostate cancer ([place]) 2005    Family History  Problem Relation Age of Onset  . Colon cancer Mother 29  . Stomach cancer Neg Hx     Past Surgical History:  Procedure Laterality Date  . kidney stones  1990  . surgery for prostate cancer  2005   Social History   Occupational History  . Not on file  Tobacco Use  . Smoking status: Former Smoker    Last attempt to quit: 05/19/1963    Years since quitting: 54.2  . Smokeless tobacco: Never Used  Substance and Sexual Activity  . Alcohol use: Yes    Alcohol/week: 0.0 oz    Comment: Social  . Drug use: No  . Sexual activity: Not on file

## 2017-08-16 ENCOUNTER — Encounter (INDEPENDENT_AMBULATORY_CARE_PROVIDER_SITE_OTHER): Payer: Self-pay | Admitting: Orthopedic Surgery

## 2017-08-16 ENCOUNTER — Ambulatory Visit (INDEPENDENT_AMBULATORY_CARE_PROVIDER_SITE_OTHER): Payer: Medicare Other | Admitting: Orthopedic Surgery

## 2017-08-16 ENCOUNTER — Ambulatory Visit (INDEPENDENT_AMBULATORY_CARE_PROVIDER_SITE_OTHER): Payer: Medicare Other

## 2017-08-16 DIAGNOSIS — M25532 Pain in left wrist: Secondary | ICD-10-CM | POA: Diagnosis not present

## 2017-08-16 DIAGNOSIS — S52572D Other intraarticular fracture of lower end of left radius, subsequent encounter for closed fracture with routine healing: Secondary | ICD-10-CM | POA: Diagnosis not present

## 2017-08-17 ENCOUNTER — Encounter (INDEPENDENT_AMBULATORY_CARE_PROVIDER_SITE_OTHER): Payer: Self-pay | Admitting: Orthopedic Surgery

## 2017-08-17 NOTE — Progress Notes (Signed)
Office Visit Note   Patient: Samuel Barry           Date of Birth: 1945-06-05           MRN: [id number] Visit Date: 08/16/2017 Requested by: Lottie Dawson, MD [place]  [address], [place] [address] PCP: Kelton Pillar, Herschell Dimes, MD  Subjective: Chief Complaint  Patient presents with  . Left Wrist - Follow-up, Fracture, Pain    HPI: Samuel Barry is a patient with left wrist fracture.  Since I have seen him he has had a CT scan.  He has been in the splint.  CT scan is reviewed with wife.  He has a fairly minimally displaced distal radius fracture but the distal radial ulnar joint does have some diastases.  Reviewed with the radiologist who agrees.              ROS: All systems reviewed are negative as they relate to the chief complaint within the history of present illness.  Patient denies  fevers or chills.   Assessment & Plan: Visit Diagnoses:  1. Pain in left wrist   2. Other closed intra-articular fracture of distal end of left radius with routine healing, subsequent encounter     Plan: Impression is distal radius fracture which in and of itself should heal with minimal difficulty.  Potentially more symptomatic and problematic is the distal radial ulnar joint injury.  He is got pretty reasonable pronation supination today but some pain with supination.  The distal ulna is slightly more ballotable on the left than the right.  I discussed with Hilton and his wife at length with over 45 minutes of face-to-face counseling time the operative and nonoperative options for treating this particular injury.  The diastases could be reduced and pinned.  For about 4-6 weeks of intake.  During that time he would be very functional with left wrist particularly pronation supination.  Alternatively we can let this heal and see how he does.  The literature does show a paper outcomes between both treatment.  After much discussion back and forth patient decides that he would like to let  this heal on its own.  Either course of treatment he will likely have some degree of discomfort in the distal radial ulnar joint.  I will see him back in 4 weeks for clinical recheck and repeat radiographs.  Follow-Up Instructions: Return in about 1 month (around 09/13/2017).   Orders:  Orders Placed This Encounter  Procedures  . XR Wrist 2 Views Left   No orders of the defined types were placed in this encounter.     Procedures: No procedures performed   Clinical Data: No additional findings.  Objective: Vital Signs: There were no vitals taken for this visit.  Physical Exam:   Constitutional: Patient appears well-developed HEENT:  Head: Normocephalic Eyes:EOM are normal Neck: Normal range of motion Cardiovascular: Normal rate Pulmonary/chest: Effort normal Neurologic: Patient is alert Skin: Skin is warm Psychiatric: Patient has normal mood and affect    Ortho Exam: Orthopedic exam demonstrates fairly minimal radial sided tenderness on that left wrist.  On the ulnar side there is slightly more tenderness.  Little bit more mobility 1-2 mm of that all in relation to the distal radius on the left compared to the right.  The arm does not dislocate relative to the distal radius with pronation and supination.  Radial pulses intact.  Motor sensory function to the hand otherwise intact on the left.  Specialty  Comments:  No specialty comments available.  Imaging: Xr Wrist 2 Views Left  Result Date: 08/17/2017 AP bilateral wrists reviewed.  There is asymmetric widening of the distal radial ulnar joint on the left compared to the right.  This is about 2 mm.  No change in fracture alignment of the nondisplaced metaphyseal distal radius fracture on the left-hand side.    PMFS History: Patient Active Problem List   Diagnosis Date Noted  . HYPERTENSION 11/25/2007  . ALLERGIC RHINITIS 11/25/2007  . OSTEOARTHRITIS 11/25/2007  . PROSTATE CANCER, HX OF 11/25/2007  . COLONIC  POLYPS, HX OF 11/25/2007   Past Medical History:  Diagnosis Date  . Allergy   . Depression   . Hypertension   . Kidney stones   . Memory loss   . Prostate cancer ([place]) 2005    Family History  Problem Relation Age of Onset  . Colon cancer Mother 18  . Stomach cancer Neg Hx     Past Surgical History:  Procedure Laterality Date  . kidney stones  1990  . surgery for prostate cancer  2005   Social History   Occupational History  . Not on file  Tobacco Use  . Smoking status: Former Smoker    Last attempt to quit: 05/19/1963    Years since quitting: 54.2  . Smokeless tobacco: Never Used  Substance and Sexual Activity  . Alcohol use: Yes    Alcohol/week: 0.0 oz    Comment: Social  . Drug use: No  . Sexual activity: Not on file

## 2017-08-25 DIAGNOSIS — R634 Abnormal weight loss: Secondary | ICD-10-CM | POA: Diagnosis not present

## 2017-08-25 DIAGNOSIS — R109 Unspecified abdominal pain: Secondary | ICD-10-CM | POA: Diagnosis not present

## 2017-09-02 DIAGNOSIS — N200 Calculus of kidney: Secondary | ICD-10-CM | POA: Diagnosis not present

## 2017-09-02 DIAGNOSIS — R634 Abnormal weight loss: Secondary | ICD-10-CM | POA: Diagnosis not present

## 2017-09-02 DIAGNOSIS — R8290 Unspecified abnormal findings in urine: Secondary | ICD-10-CM | POA: Diagnosis not present

## 2017-09-02 DIAGNOSIS — N529 Male erectile dysfunction, unspecified: Secondary | ICD-10-CM | POA: Diagnosis not present

## 2017-09-02 DIAGNOSIS — I700 Atherosclerosis of aorta: Secondary | ICD-10-CM | POA: Diagnosis not present

## 2017-09-02 DIAGNOSIS — R109 Unspecified abdominal pain: Secondary | ICD-10-CM | POA: Diagnosis not present

## 2017-09-02 DIAGNOSIS — C61 Malignant neoplasm of prostate: Secondary | ICD-10-CM | POA: Diagnosis not present

## 2017-09-13 ENCOUNTER — Encounter (INDEPENDENT_AMBULATORY_CARE_PROVIDER_SITE_OTHER): Payer: Self-pay | Admitting: Orthopedic Surgery

## 2017-09-13 ENCOUNTER — Ambulatory Visit (INDEPENDENT_AMBULATORY_CARE_PROVIDER_SITE_OTHER): Payer: Medicare Other

## 2017-09-13 ENCOUNTER — Ambulatory Visit (INDEPENDENT_AMBULATORY_CARE_PROVIDER_SITE_OTHER): Payer: Medicare Other | Admitting: Orthopedic Surgery

## 2017-09-13 DIAGNOSIS — S52502D Unspecified fracture of the lower end of left radius, subsequent encounter for closed fracture with routine healing: Secondary | ICD-10-CM

## 2017-09-13 NOTE — Progress Notes (Signed)
   Post-Op Visit Note   Patient: Samuel Barry           Date of Birth: 06-09-1945           MRN: [id number] Visit Date: 09/13/2017 PCP: Samuel Dawson, MD   Assessment & Plan:  Chief Complaint:  Chief Complaint  Patient presents with  . Left Wrist - Follow-up, Fracture   Visit Diagnoses:  1. Closed fracture of distal end of left radius with routine healing, unspecified fracture morphology, subsequent encounter     Plan: Samuel Barry is a patient who is now 5 weeks out left hand wrist fracture.  Been doing reasonably well.'s been in a splint.  Date of injury 08/05/2017.  On examination still has a little bit more motion with pronation supination of that distal ulna but is not particular painful.  He has about half of his flexion extension.  Grip strength predictably diminished.  No tenderness around the fracture site.  Radiographs show callus formation.  Plan at this time is to discontinue the splint after this weekend which will be 6 weeks of immobilization.  Then is okay for him to do activities of daily living only.  I will see him back in 5 weeks no re-x-rays but just clinical recheck on range of motion and likely let him start doing more active things at that time  Follow-Up Instructions: Return in about 8 weeks (around 11/08/2017).   Orders:  Orders Placed This Encounter  Procedures  . XR Wrist Complete Left   No orders of the defined types were placed in this encounter.   Imaging: Xr Wrist Complete Left  Result Date: 09/13/2017 AP lateral oblique left wrist reviewed.  Interval healing of the distal radius fracture is present with no significant change in alignment.  Some persistent widening of the distal radial ulnar joint is noted.   PMFS History: Patient Active Problem List   Diagnosis Date Noted  . HYPERTENSION 11/25/2007  . ALLERGIC RHINITIS 11/25/2007  . OSTEOARTHRITIS 11/25/2007  . PROSTATE CANCER, HX OF 11/25/2007  . COLONIC POLYPS, HX OF 11/25/2007    Past Medical History:  Diagnosis Date  . Allergy   . Depression   . Hypertension   . Kidney stones   . Memory loss   . Prostate cancer ([place]) 2005    Family History  Problem Relation Age of Onset  . Colon cancer Mother 24  . Stomach cancer Neg Hx     Past Surgical History:  Procedure Laterality Date  . kidney stones  1990  . surgery for prostate cancer  2005   Social History   Occupational History  . Not on file  Tobacco Use  . Smoking status: Former Smoker    Last attempt to quit: 05/19/1963    Years since quitting: 54.3  . Smokeless tobacco: Never Used  Substance and Sexual Activity  . Alcohol use: Yes    Alcohol/week: 0.0 oz    Comment: Social  . Drug use: No  . Sexual activity: Not on file

## 2017-09-28 DIAGNOSIS — N200 Calculus of kidney: Secondary | ICD-10-CM | POA: Diagnosis not present

## 2017-10-14 DIAGNOSIS — N529 Male erectile dysfunction, unspecified: Secondary | ICD-10-CM | POA: Diagnosis not present

## 2017-10-14 DIAGNOSIS — N200 Calculus of kidney: Secondary | ICD-10-CM | POA: Diagnosis not present

## 2017-10-14 DIAGNOSIS — C61 Malignant neoplasm of prostate: Secondary | ICD-10-CM | POA: Diagnosis not present

## 2017-10-14 DIAGNOSIS — R109 Unspecified abdominal pain: Secondary | ICD-10-CM | POA: Diagnosis not present

## 2017-10-18 ENCOUNTER — Ambulatory Visit (INDEPENDENT_AMBULATORY_CARE_PROVIDER_SITE_OTHER): Payer: Medicare Other | Admitting: Orthopedic Surgery

## 2017-10-22 ENCOUNTER — Ambulatory Visit (INDEPENDENT_AMBULATORY_CARE_PROVIDER_SITE_OTHER): Payer: Medicare Other | Admitting: Orthopedic Surgery

## 2017-10-22 DIAGNOSIS — S52502D Unspecified fracture of the lower end of left radius, subsequent encounter for closed fracture with routine healing: Secondary | ICD-10-CM

## 2017-10-24 ENCOUNTER — Encounter (INDEPENDENT_AMBULATORY_CARE_PROVIDER_SITE_OTHER): Payer: Self-pay | Admitting: Orthopedic Surgery

## 2017-10-24 NOTE — Progress Notes (Signed)
   Post-Op Visit Note   Patient: Samuel Barry           Date of Birth: [birth year]           MRN: [id number] Visit Date: 10/22/2017 PCP: Lottie Dawson, MD   Assessment & Plan:  Chief Complaint:  Chief Complaint  Patient presents with  . Left Wrist - Pain   Visit Diagnoses:  1. Closed fracture of distal end of left radius with routine healing, unspecified fracture morphology, subsequent encounter    Samuel Barry is a patient who is now almost 10 weeks out from left wrist fracture.  He is been doing well.  Treated nonoperatively.  On exam he has about 15 degrees less dorsiflexion and palmar flexion of the left wrist compared to the right but his grip strength is improving no significant restriction of pronation and supination.  Plan at this time is that he is doing well with nonoperative treatment of the wrist.  I will see him back as needed.  He is losing weight which is being worked up by his primary care physician. Plan: See above  Follow-Up Instructions: Return if symptoms worsen or fail to improve.   Orders:  No orders of the defined types were placed in this encounter.  No orders of the defined types were placed in this encounter.   Imaging: No results found.  PMFS History: Patient Active Problem List   Diagnosis Date Noted  . HYPERTENSION 11/25/2007  . ALLERGIC RHINITIS 11/25/2007  . OSTEOARTHRITIS 11/25/2007  . PROSTATE CANCER, HX OF 11/25/2007  . COLONIC POLYPS, HX OF 11/25/2007   Past Medical History:  Diagnosis Date  . Allergy   . Depression   . Hypertension   . Kidney stones   . Memory loss   . Prostate cancer ([place]) 2005    Family History  Problem Relation Age of Onset  . Colon cancer Mother 80  . Stomach cancer Neg Hx     Past Surgical History:  Procedure Laterality Date  . kidney stones  1990  . surgery for prostate cancer  2005   Social History   Occupational History  . Not on file  Tobacco Use  . Smoking status: Former Smoker    Last  attempt to quit: 05/19/1963    Years since quitting: 54.4  . Smokeless tobacco: Never Used  Substance and Sexual Activity  . Alcohol use: Yes    Alcohol/week: 0.0 oz    Comment: Social  . Drug use: No  . Sexual activity: Not on file

## 2017-10-29 DIAGNOSIS — R109 Unspecified abdominal pain: Secondary | ICD-10-CM | POA: Diagnosis not present

## 2017-10-29 DIAGNOSIS — K869 Disease of pancreas, unspecified: Secondary | ICD-10-CM | POA: Diagnosis not present

## 2017-10-29 DIAGNOSIS — N289 Disorder of kidney and ureter, unspecified: Secondary | ICD-10-CM | POA: Diagnosis not present

## 2017-10-29 DIAGNOSIS — R935 Abnormal findings on diagnostic imaging of other abdominal regions, including retroperitoneum: Secondary | ICD-10-CM | POA: Diagnosis not present

## 2017-11-04 DIAGNOSIS — R109 Unspecified abdominal pain: Secondary | ICD-10-CM | POA: Diagnosis not present

## 2017-11-04 DIAGNOSIS — N529 Male erectile dysfunction, unspecified: Secondary | ICD-10-CM | POA: Diagnosis not present

## 2017-11-04 DIAGNOSIS — C61 Malignant neoplasm of prostate: Secondary | ICD-10-CM | POA: Diagnosis not present

## 2017-12-15 DIAGNOSIS — R1013 Epigastric pain: Secondary | ICD-10-CM | POA: Diagnosis not present

## 2017-12-15 DIAGNOSIS — R634 Abnormal weight loss: Secondary | ICD-10-CM | POA: Diagnosis not present

## 2018-01-14 DIAGNOSIS — I10 Essential (primary) hypertension: Secondary | ICD-10-CM | POA: Diagnosis not present

## 2018-01-14 DIAGNOSIS — K869 Disease of pancreas, unspecified: Secondary | ICD-10-CM | POA: Diagnosis not present

## 2018-01-14 DIAGNOSIS — K838 Other specified diseases of biliary tract: Secondary | ICD-10-CM | POA: Diagnosis not present

## 2018-01-14 DIAGNOSIS — C61 Malignant neoplasm of prostate: Secondary | ICD-10-CM | POA: Diagnosis not present

## 2018-01-14 DIAGNOSIS — R933 Abnormal findings on diagnostic imaging of other parts of digestive tract: Secondary | ICD-10-CM | POA: Diagnosis not present

## 2018-01-14 DIAGNOSIS — K861 Other chronic pancreatitis: Secondary | ICD-10-CM | POA: Diagnosis not present

## 2018-01-14 DIAGNOSIS — K8689 Other specified diseases of pancreas: Secondary | ICD-10-CM | POA: Diagnosis not present

## 2018-03-10 DIAGNOSIS — R109 Unspecified abdominal pain: Secondary | ICD-10-CM | POA: Diagnosis not present

## 2018-03-10 DIAGNOSIS — R3129 Other microscopic hematuria: Secondary | ICD-10-CM | POA: Diagnosis not present

## 2018-03-10 DIAGNOSIS — N529 Male erectile dysfunction, unspecified: Secondary | ICD-10-CM | POA: Diagnosis not present

## 2018-04-28 DIAGNOSIS — K860 Alcohol-induced chronic pancreatitis: Secondary | ICD-10-CM | POA: Diagnosis not present

## 2018-04-28 DIAGNOSIS — C253 Malignant neoplasm of pancreatic duct: Secondary | ICD-10-CM | POA: Diagnosis not present

## 2018-04-28 DIAGNOSIS — K8689 Other specified diseases of pancreas: Secondary | ICD-10-CM | POA: Diagnosis not present

## 2018-04-28 DIAGNOSIS — R935 Abnormal findings on diagnostic imaging of other abdominal regions, including retroperitoneum: Secondary | ICD-10-CM | POA: Diagnosis not present

## 2018-05-10 DIAGNOSIS — N281 Cyst of kidney, acquired: Secondary | ICD-10-CM | POA: Diagnosis not present

## 2018-05-10 DIAGNOSIS — R935 Abnormal findings on diagnostic imaging of other abdominal regions, including retroperitoneum: Secondary | ICD-10-CM | POA: Diagnosis not present

## 2018-05-10 DIAGNOSIS — K8689 Other specified diseases of pancreas: Secondary | ICD-10-CM | POA: Diagnosis not present

## 2018-08-30 ENCOUNTER — Ambulatory Visit (HOSPITAL_COMMUNITY)
Admission: EM | Admit: 2018-08-30 | Discharge: 2018-08-30 | Disposition: A | Discharge status: Home / Self Care | Payer: Medicare Other | Attending: Emergency Medicine | Admitting: Emergency Medicine

## 2018-08-30 ENCOUNTER — Encounter (HOSPITAL_COMMUNITY): Payer: Self-pay | Admitting: Emergency Medicine

## 2018-08-30 DIAGNOSIS — R1033 Periumbilical pain: Secondary | ICD-10-CM

## 2018-08-30 MED ORDER — DICYCLOMINE HCL 20 MG PO TABS: 20.0000 mg | ORAL_TABLET | Freq: Two times a day (BID) | ORAL | 0 refills | Status: DC

## 2018-08-30 NOTE — ED Triage Notes (Signed)
Pt c/o center abdominal pain x2 months, non tender to palpation.

## 2018-08-30 NOTE — Discharge Instructions (Addendum)
You can try 1 g of Tylenol with 200 mg of ibuprofen 3 or 4 times a day.  Do not take the ibuprofen for more than 2 or 3 days.  He can try the Bentyl, but it may cause constipation.  I would be careful with this.  If you need to follow-up with your gastroenterologist as soon as possible.  Go immediately to the emergency department for fevers above 100.4, chest pain, if you pass out, if the pain changes or gets worse, or for any other concerns.

## 2018-08-30 NOTE — ED Provider Notes (Signed)
HPI  SUBJECTIVE:  Samuel Barry is a 73 y.o. male who presents with stabbing, sore, intermittent, hours long daily periumbilical pain that has been present since 2019 after undergoing voluntary lithotripsy.  He denies chest pain, nausea, vomiting, fevers, shortness of breath, fevers.  He reports a 40 pound unintentional weight loss over the past year.  No abdominal distention.  He is having normal bowel movements.  No change in the caliber of his stools.  No urinary complaints.  No back pain, syncope.  He has tried Tylenol with some improvement in his symptoms, also states that eating helps.  It is not associated with exercising, exertion, fasting.  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  No history of aortic abdominal aneurysm, atrial fibrillation, mesenteric ischemia, MI, hypercholesterolemia.  Family history significant for liver cancer.  He denies alcohol use.  He is a former smoker quit 20-25 years ago.  PMD: He is establishing care with Dr. Arline Asp in several weeks.  He is followed closely by his urologist, Dr. Anthoney Harada and has also been seen by Dr. Dennie Maizes, GI for this abdominal pain.  Patient states that his pain today is not any different than the pain that he has been evaluated for in the past, but that he wanted a "second opinion".  Patient had a recent MRI/MRCP and CA 19-25 April 2018.  MRI showed stable diffuse pancreatic ductal dilatation, no evidence of pancreatic neoplasm or other acute findings, no biliary ductal dilatation, gallstones, aortic abdominal aneurysm. CA 19-9 was negative per urology note.  He was told to follow-up with the GI clinic at [facility] in 4 months on his urology visit in January.  Past Medical History:  Diagnosis Date  . Allergy   . Depression   . Hypertension   . Kidney stones   . Memory loss   . Prostate cancer ([place]) 2005    Past Surgical History:   Procedure Laterality Date  . kidney stones  1990  . surgery for prostate cancer  2005    Family History  Problem Relation Age of Onset  . Colon cancer Mother 26  . Stomach cancer Neg Hx     Social History   Tobacco Use  . Smoking status: Former Smoker    Last attempt to quit: 05/19/1963    Years since quitting: 55.3  . Smokeless tobacco: Never Used  Substance Use Topics  . Alcohol use: Yes    Alcohol/week: 0.0 standard drinks    Comment: Social  . Drug use: No    No current facility-administered medications for this encounter.   Current Outpatient Medications:  .  amLODipine (NORVASC) 5 MG tablet, Take 5 mg by mouth daily. , Disp: , Rfl: 6 .  dicyclomine (BENTYL) 20 MG tablet, Take 1 tablet (20 mg total) by mouth 2 (two) times daily., Disp: 20 tablet, Rfl: 0 .  donepezil (ARICEPT) 10 MG tablet, Take 1/2 tablet daily for 1 week, then increase to 1 tablet daily, Disp: 30 tablet, Rfl: 6 .  hydrochlorothiazide (HYDRODIURIL) 25 MG tablet, Take 25 mg by mouth daily., Disp: , Rfl: 0 .  tadalafil (CIALIS) 10 MG tablet, 1 tablet, Disp: , Rfl:   Allergies  Allergen Reactions  . Doxycycline Monohydrate Itching    *was taking two antibiotic at same time   . Sulfamethoxazole-Trimethoprim Itching    *was taking antibiotics at same time.  Marland Kitchen Penicillin G Rash     ROS  As noted in HPI.   Physical Exam  BP 138/78   Pulse (!) 106   Temp 98.3 F (36.8 C)   Resp 16   SpO2 98%   Constitutional: Well developed, well nourished, no acute distress Eyes:  EOMI, conjunctiva normal bilaterally HENT: Normocephalic, atraumatic,mucus membranes moist Respiratory: Normal inspiratory effort, lungs clear bilaterally, good air movement. Cardiovascular: Mild regular tachycardia, no murmurs rubs or gallops. GI: Normal appearance, soft, nontender, nondistended, active bowel sounds, no rebound, guarding.  Negative Murphy, negative McBurney.  No periumbilical hernia noted with Valsalva. skin: No  rash, skin intact Musculoskeletal: no deformities Neurologic: Alert & oriented x 3, no focal neuro deficits Psychiatric: Speech and behavior appropriate   ED Course   Medications - No data to display  No orders of the defined types were placed in this encounter.   No results found for this or any previous visit (from the past 24 hour(s)). No results found.  ED Clinical Impression  Periumbilical abdominal pain   ED Assessment/Plan  Outside labs, radiology reports, records reviewed.  As noted in HPI.  MRI 3 months ago negative for cancer, aortic abdominal aneurysm, any acute changes, recent cancer antigen was also negative.  His abdomen is benign today, vitals are acceptable.  Doubt cardiac etiology.  Went over and discussed radiology and lab results with him.  Discussed with both patient and spouse over the phone that I was not sure as to the etiology of the symptoms, but there did not appear to be an emergency today.  Encouraged him to follow-up with GI for more definitive answers.  Will try some ibuprofen 200 mg combined with 500 mg to 1 g of Tylenol 3-4 times a day for several days, and some Bentyl twice a day to see if this does not help.  Gave patient strict ER return precautions.  Discussed MDM, treatment plan, and plan for follow-up with patient. Discussed sn/sx that should prompt return to the ED. patient agrees with plan.   Meds ordered this encounter  Medications  . dicyclomine (BENTYL) 20 MG tablet    Sig: Take 1 tablet (20 mg total) by mouth 2 (two) times daily.    Dispense:  20 tablet    Refill:  0    *This clinic note was created using [employer]. Therefore, there may be occasional mistakes despite careful proofreading.   ?   Melynda Ripple, MD 08/30/18 2018

## 2018-10-24 IMAGING — DX DG WRIST COMPLETE 3+V*L*
4 series · 4 of 4 positions shown · non-contrast
Comparison: None.

CLINICAL DATA: Left wrist injury

EXAM:
LEFT WRIST - COMPLETE 3+ VIEW

[wrist pa]
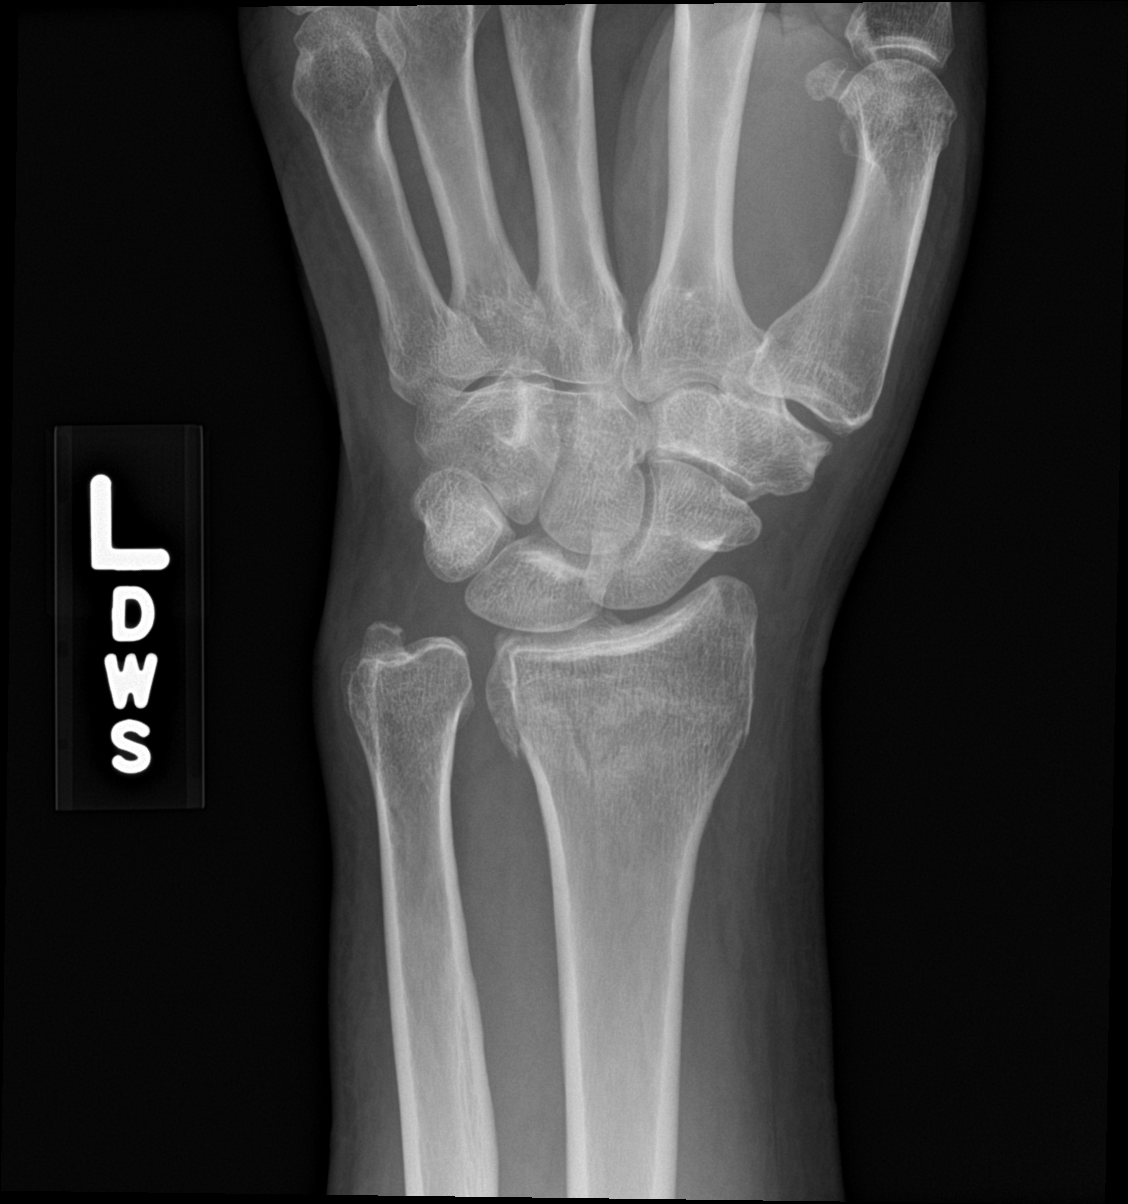

[wrist navicular]
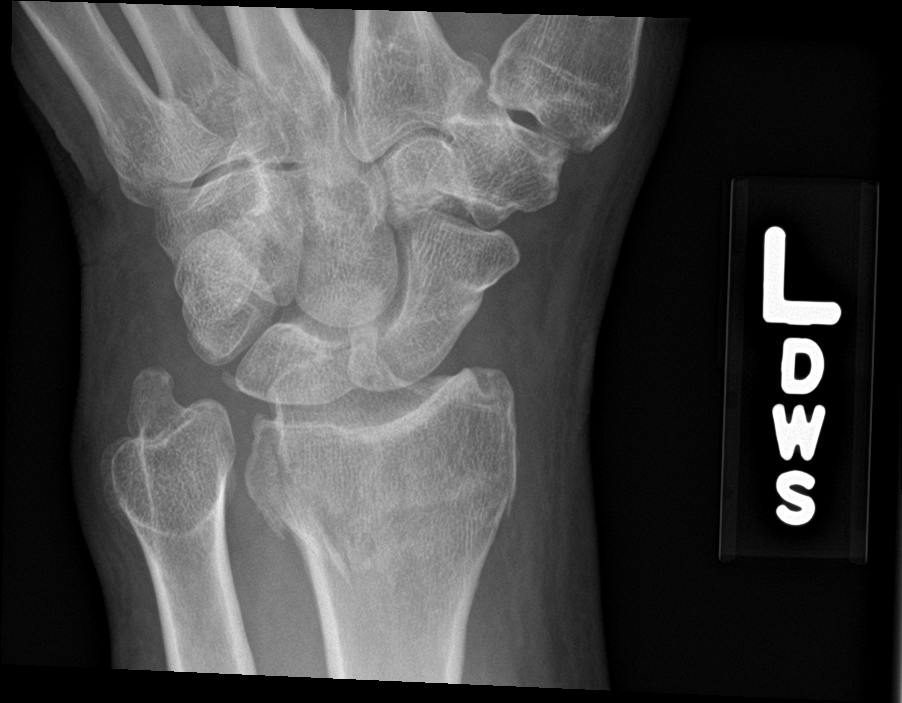

[wrist obl]
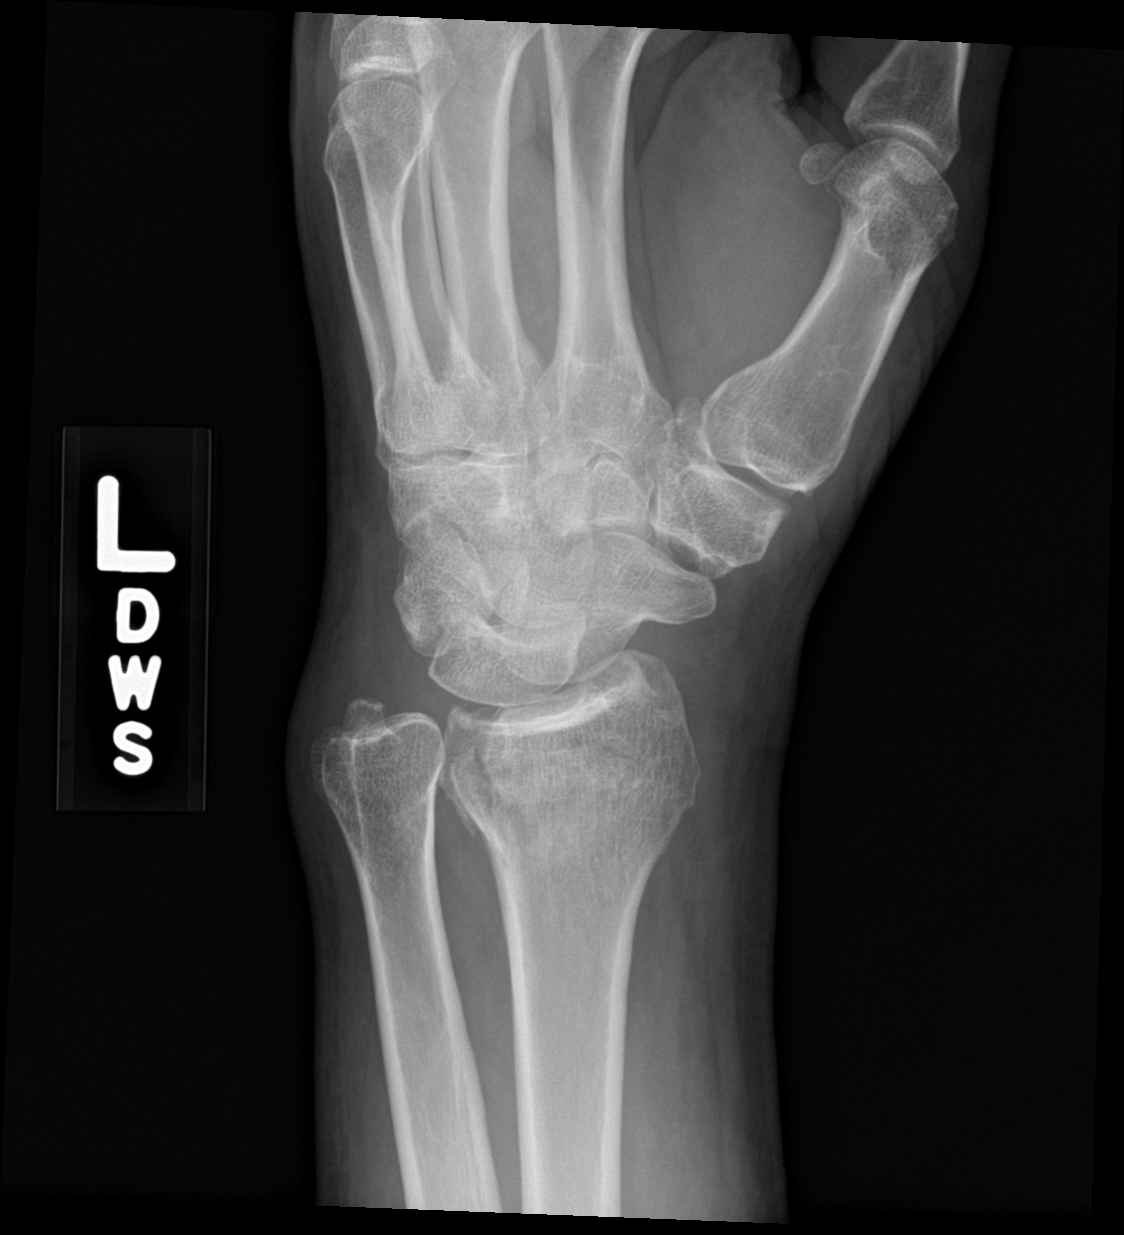

[wrist lat]
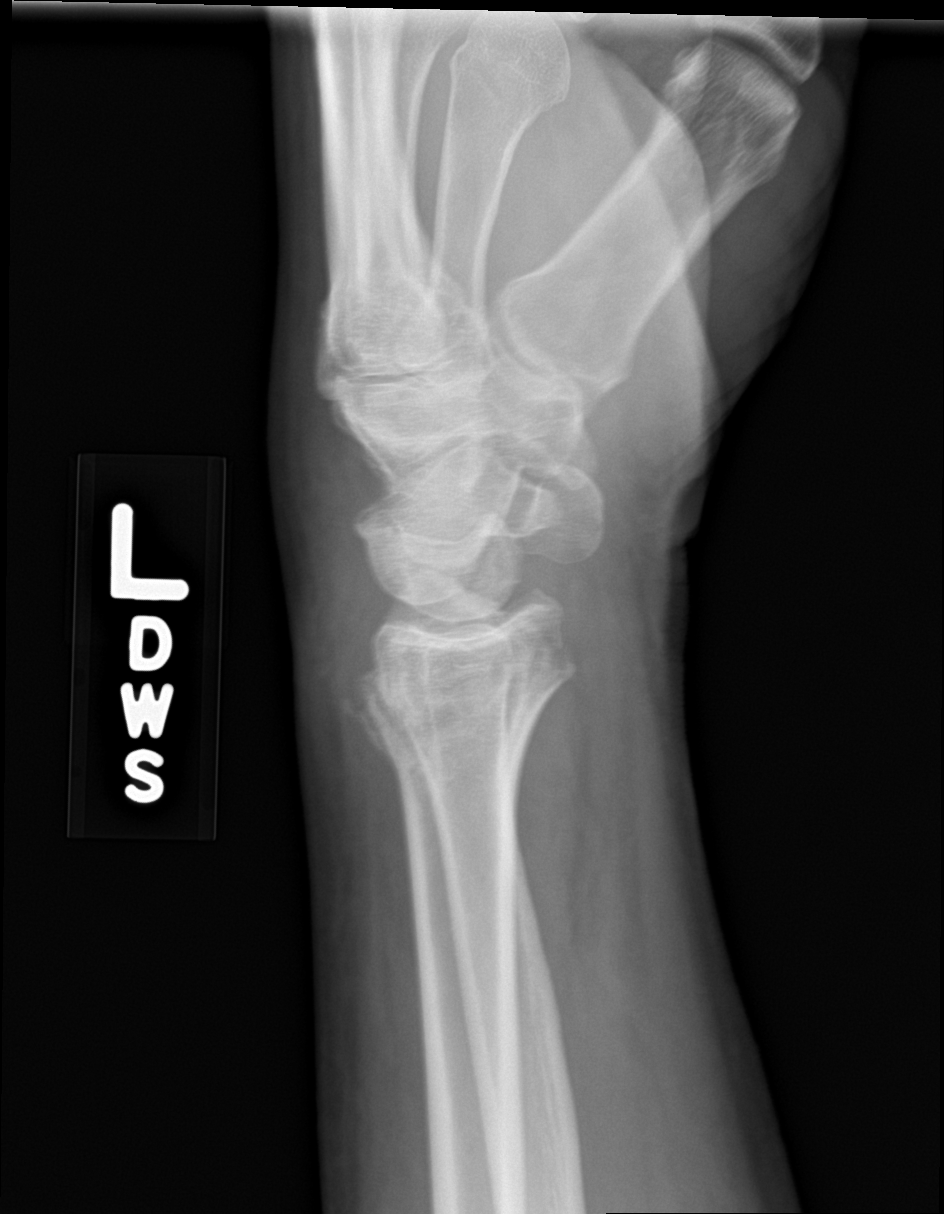

[4 of 4 positions shown; findings below may reference images not displayed]

FINDINGS: Acute comminuted and impacted intra-articular distal radius
fracture. No significant angulation.
IMPRESSION: Acute impacted and comminuted intra-articular distal radius
fracture.

## 2019-03-31 ENCOUNTER — Other Ambulatory Visit: Payer: Self-pay | Admitting: Endocrinology

## 2019-03-31 ENCOUNTER — Other Ambulatory Visit (HOSPITAL_COMMUNITY): Payer: Self-pay | Admitting: Endocrinology

## 2019-03-31 DIAGNOSIS — E0590 Thyrotoxicosis, unspecified without thyrotoxic crisis or storm: Secondary | ICD-10-CM

## 2019-04-24 ENCOUNTER — Encounter (HOSPITAL_COMMUNITY): Payer: Medicare Other

## 2019-04-24 ENCOUNTER — Other Ambulatory Visit (HOSPITAL_COMMUNITY): Payer: Medicare Other

## 2019-04-25 ENCOUNTER — Other Ambulatory Visit (HOSPITAL_COMMUNITY): Payer: Medicare Other

## 2019-05-03 ENCOUNTER — Encounter (HOSPITAL_COMMUNITY)
Admission: RE | Admit: 2019-05-03 | Discharge: 2019-05-03 | Disposition: A | Discharge status: Home / Self Care | Payer: Medicare Other | Source: Ambulatory Visit | Attending: Endocrinology | Admitting: Endocrinology

## 2019-05-03 ENCOUNTER — Other Ambulatory Visit: Payer: Self-pay

## 2019-05-03 DIAGNOSIS — E0590 Thyrotoxicosis, unspecified without thyrotoxic crisis or storm: Secondary | ICD-10-CM | POA: Diagnosis present

## 2019-05-03 MED ORDER — SODIUM IODIDE I-123 7.4 MBQ CAPS
410.0000 | ORAL_CAPSULE | Freq: Once | ORAL | Status: AC
  Administered 2019-05-03: 410 via ORAL

## 2019-05-04 ENCOUNTER — Encounter (HOSPITAL_COMMUNITY)
Admission: RE | Admit: 2019-05-04 | Discharge: 2019-05-04 | Disposition: A | Discharge status: Home / Self Care | Payer: Medicare Other | Source: Ambulatory Visit | Attending: Endocrinology | Admitting: Endocrinology

## 2019-12-22 ENCOUNTER — Other Ambulatory Visit: Payer: Self-pay | Admitting: Student

## 2019-12-22 DIAGNOSIS — K861 Other chronic pancreatitis: Secondary | ICD-10-CM

## 2020-01-04 ENCOUNTER — Other Ambulatory Visit: Payer: Medicare Other
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
## 2020-03-12 ENCOUNTER — Encounter: Payer: Self-pay | Admitting: Neurology

## 2020-03-12 ENCOUNTER — Ambulatory Visit: Payer: Medicare (Managed Care) | Admitting: Neurology

## 2020-03-12 VITALS — BP 154/93 | HR 87 | Ht 70.5 in | Wt 165.0 lb

## 2020-03-12 DIAGNOSIS — F0150 Vascular dementia without behavioral disturbance: Secondary | ICD-10-CM | POA: Insufficient documentation

## 2020-03-12 DIAGNOSIS — R411 Anterograde amnesia: Secondary | ICD-10-CM | POA: Diagnosis not present

## 2020-03-12 MED ORDER — DONEPEZIL HCL 10 MG PO TABS: 10.0000 mg | ORAL_TABLET | Freq: Every day | ORAL | 3 refills | Status: AC

## 2020-03-12 NOTE — Patient Instructions (Addendum)
Increase Aricept 10mg  Next appointment start Memantine   Memantine Tablets What is this medicine? MEMANTINE (MEM an teen) is used to treat dementia caused by Alzheimer's disease. This medicine may be used for other purposes; ask your health care provider or pharmacist if you have questions. COMMON BRAND NAME(S): Namenda What should I tell my health care provider before I take this medicine? They need to know if you have any of these conditions:  difficulty passing urine  kidney disease  liver disease  seizures  an unusual or allergic reaction to memantine, other medicines, foods, dyes, or preservatives  pregnant or trying to get pregnant  breast-feeding How should I use this medicine? Take this medicine by mouth with a glass of water. Follow the directions on the prescription label. You may take this medicine with or without food. Take your doses at regular intervals. Do not take your medicine more often than directed. Continue to take your medicine even if you feel better. Do not stop taking except on the advice of your doctor or health care professional. Talk to your pediatrician regarding the use of this medicine in children. Special care may be needed. Overdosage: If you think you have taken too much of this medicine contact a poison control center or emergency room at once. NOTE: This medicine is only for you. Do not share this medicine with others. What if I miss a dose? If you miss a dose, take it as soon as you can. If it is almost time for your next dose, take only that dose. Do not take double or extra doses. If you do not take your medicine for several days, contact your health care provider. Your dose may need to be changed. What may interact with this medicine?  acetazolamide  amantadine  cimetidine  dextromethorphan  dofetilide  hydrochlorothiazide  ketamine  metformin  methazolamide  quinidine  ranitidine  sodium bicarbonate  triamterene This  list may not describe all possible interactions. Give your health care provider a list of all the medicines, herbs, non-prescription drugs, or dietary supplements you use. Also tell them if you smoke, drink alcohol, or use illegal drugs. Some items may interact with your medicine. What should I watch for while using this medicine? Visit your doctor or health care professional for regular checks on your progress. Check with your doctor or health care professional if there is no improvement in your symptoms or if they get worse. You may get drowsy or dizzy. Do not drive, use machinery, or do anything that needs mental alertness until you know how this drug affects you. Do not stand or sit up quickly, especially if you are an older patient. This reduces the risk of dizzy or fainting spells. Alcohol can make you more drowsy and dizzy. Avoid alcoholic drinks. What side effects may I notice from receiving this medicine? Side effects that you should report to your doctor or health care professional as soon as possible:  allergic reactions like skin rash, itching or hives, swelling of the face, lips, or tongue  agitation or a feeling of restlessness  depressed mood  dizziness  hallucinations  redness, blistering, peeling or loosening of the skin, including inside the mouth  seizures  vomiting Side effects that usually do not require medical attention (report to your doctor or health care professional if they continue or are bothersome):  constipation  diarrhea  headache  nausea  trouble sleeping This list may not describe all possible side effects. Call your doctor for medical  advice about side effects. You may report side effects to FDA at 1-800-FDA-1088. Where should I keep my medicine? Keep out of the reach of children. Store at room temperature between 15 degrees and 30 degrees C (59 degrees and 86 degrees F). Throw away any unused medicine after the expiration date. NOTE: This sheet  is a summary. It may not cover all possible information. If you have questions about this medicine, talk to your doctor, pharmacist, or health care provider.  2020 Elsevier/Gold Standard (2013-02-20 14:10:42) Donepezil tablets What is this medicine? DONEPEZIL (doe NEP e zil) is used to treat mild to moderate dementia caused by Alzheimer's disease. This medicine may be used for other purposes; ask your health care provider or pharmacist if you have questions. COMMON BRAND NAME(S): Aricept What should I tell my health care provider before I take this medicine? They need to know if you have any of these conditions:  asthma or other lung disease  difficulty passing urine  head injury  heart disease  history of irregular heartbeat  liver disease  seizures (convulsions)  stomach or intestinal disease, ulcers or stomach bleeding  an unusual or allergic reaction to donepezil, other medicines, foods, dyes, or preservatives  pregnant or trying to get pregnant  breast-feeding How should I use this medicine? Take this medicine by mouth with a glass of water. Follow the directions on the prescription label. You may take this medicine with or without food. Take this medicine at regular intervals. This medicine is usually taken before bedtime. Do not take it more often than directed. Continue to take your medicine even if you feel better. Do not stop taking except on your doctor's advice. If you are taking the 23 mg donepezil tablet, swallow it whole; do not cut, crush, or chew it. Talk to your pediatrician regarding the use of this medicine in children. Special care may be needed. Overdosage: If you think you have taken too much of this medicine contact a poison control center or emergency room at once. NOTE: This medicine is only for you. Do not share this medicine with others. What if I miss a dose? If you miss a dose, take it as soon as you can. If it is almost time for your next dose,  take only that dose, do not take double or extra doses. What may interact with this medicine? Do not take this medicine with any of the following medications:  certain medicines for fungal infections like itraconazole, fluconazole, posaconazole, and voriconazole  cisapride  dextromethorphan; quinidine  dronedarone  pimozide  quinidine  thioridazine This medicine may also interact with the following medications:  antihistamines for allergy, cough and cold  atropine  bethanechol  carbamazepine  certain medicines for bladder problems like oxybutynin, tolterodine  certain medicines for Parkinson's disease like benztropine, trihexyphenidyl  certain medicines for stomach problems like dicyclomine, hyoscyamine  certain medicines for travel sickness like scopolamine  dexamethasone  dofetilide  ipratropium  NSAIDs, medicines for pain and inflammation, like ibuprofen or naproxen  other medicines for Alzheimer's disease  other medicines that prolong the QT interval (cause an abnormal heart rhythm)  phenobarbital  phenytoin  rifampin, rifabutin or rifapentine  ziprasidone This list may not describe all possible interactions. Give your health care provider a list of all the medicines, herbs, non-prescription drugs, or dietary supplements you use. Also tell them if you smoke, drink alcohol, or use illegal drugs. Some items may interact with your medicine. What should I watch for while using this  medicine? Visit your doctor or health care professional for regular checks on your progress. Check with your doctor or health care professional if your symptoms do not get better or if they get worse. You may get drowsy or dizzy. Do not drive, use machinery, or do anything that needs mental alertness until you know how this drug affects you. What side effects may I notice from receiving this medicine? Side effects that you should report to your doctor or health care professional  as soon as possible:  allergic reactions like skin rash, itching or hives, swelling of the face, lips, or tongue  feeling faint or lightheaded, falls  loss of bladder control  seizures  signs and symptoms of a dangerous change in heartbeat or heart rhythm like chest pain; dizziness; fast or irregular heartbeat; palpitations; feeling faint or lightheaded, falls; breathing problems  signs and symptoms of infection like fever or chills; cough; sore throat; pain or trouble passing urine  signs and symptoms of liver injury like dark yellow or brown urine; general ill feeling or flu-like symptoms; light-colored stools; loss of appetite; nausea; right upper belly pain; unusually weak or tired; yellowing of the eyes or skin  slow heartbeat or palpitations  unusual bleeding or bruising  vomiting Side effects that usually do not require medical attention (report to your doctor or health care professional if they continue or are bothersome):  diarrhea, especially when starting treatment  headache  loss of appetite  muscle cramps  nausea  stomach upset This list may not describe all possible side effects. Call your doctor for medical advice about side effects. You may report side effects to FDA at 1-800-FDA-1088. Where should I keep my medicine? Keep out of reach of children. Store at room temperature between 15 and 30 degrees C (59 and 86 degrees F). Throw away any unused medicine after the expiration date. NOTE: This sheet is a summary. It may not cover all possible information. If you have questions about this medicine, talk to your doctor, pharmacist, or health care provider.  2020 Elsevier/Gold Standard (2018-04-25 10:33:41)   Recommendations to prevent or slow progression of cognitive decline:   Exercise You should increase exercise 30 to 45 minutes per day at least 3 days a week although 5 to 7 would be preferred. Any type of exercise (including walking) is acceptable although  a recumbent bicycle may be best if you are unsteady. Disease related apathy can be a significant roadblock to exercise and the only way to overcome this is to make it a daily routine and perhaps have a reward at the end (something your loved one loves to eat or drink perhaps) or a personal trainer coming to the home can also be very useful. In general a structured, repetitive schedule is best.   Cardiovascular Health: You should optimize all cardiovascular risk factors (blood pressure, sugar, cholesterol) as vascular disease such as strokes and heart attacks can make memory problems much worse.   Diet: Eating a heart healthy (Mediterranean) diet is also a good idea; fish and poultry instead of red meat, nuts (mostly non-peanuts), vegetables, fruits, olive oil or canola oil (instead of butter), minimal salt (use other spices to flavor foods), whole grain rice, bread, cereal and pasta and wine in moderation.  General Health: Any diseases which effect your body will effect your brain such as a pneumonia, urinary infection, blood clot, heart attack or stroke. Keep contact with your primary care doctor for regular follow ups.  Sleep. A good nights  sleep is healthy for the brain. Seven hours is recommended. If you have insomnia or poor sleep habits see the recommendations below  Tips: Structured and consistent daytime and nighttime routine, including regular wake times, bedtimes, and mealtimes, will be important for the patient to avoid confusion. Keeping frequently used items in designated places will help reduce stress from searching. If there are worries about getting lost do not let the patient leave home unaccompanied. They might benefit from wearing an identification bracelet that will help others assist in finding home if they become lost. Information about nationwide safe return services and other helpful resources may be obtained through the Alzheimer's Association helpline at  [phone].  Finances, Power of Producer, television/film/video Directives: You should consider putting legal safeguards in place with regard to financial and medical decision making. While the spouse always has power of attorney for medical and financial issues in the absence of any form, you should consider what you want in case the spouse / caregiver is no longer around or capable of making decisions.   Jet : http://www.welch.com/.pdf  Or Google "[place]" AND "An Forensic scientist for [employer]  Other States: ApartmentMom.com.ee  The signature on these forms should be notarized.   DRIVING:   Driving only during the day Drive only to familiar Locations Avoid driving during bad weather  If you would like to be tested to see if you are driving safely, [facility] has a Clinical Driving Evaluation. To schedule an appointment call [phone].                RESOURCES:  Memory Loss: Improve your short term memory By Silvio Pate  The Alzheimer's Reading Room http://[email or web address]   The Alzheimer's Compendium http://www.alzcompend.info/  [employer] www.dukefamilysupport.NKN [phone]  Recommended resources for caregivers (All can be purchased on [employer]):  1) A Caregiver's Guide to Dementia: Using Activities and Other Strategies to Prevent, Reduce and Manage Behavioral Symptoms by Osie Bond. Tyler Aas and [employer]   2) A Caregiver's Guide to [employer] Dementia by Caleen Essex MS BSN and Gaston Islam   3) What If It's Not Alzheimer's?: A Caregiver's Guide to Dementia by Koren Shiver (Author), Octaviano Batty (Editor)  3) The 36 hour day by Rabins and Mace  4) Understanding Difficult Behaviors by Merita Norton and Jocelyn  Online course for helping caregivers reduce stress, guilt and  frustration called the Caregivers Helpbook. The website is www.powerfultoolsforcaregivers.org  As a caregiver you are a Art gallery manager. Problems you face as a caregiver are usually unique to your situation and the way your loved-one's disease manifests itself. The best way to use these books is to look at the Table of Contents and read any chapters of interest or that apply to challenges you are having as a caregiver.  NATIONAL RESOURCES: For more information on neurological disorders or research programs funded by the [employer] of Neurological Disorders and Stroke, contact the Institute's Agricultural consultant (BRAIN) at: [place] [address] [phone] (toll-free) MasterBoxes.it  Information on dementia is also available from the following organizations: Alzheimer's Disease Education and Referral ([place]) [place] [facility] [address] [place] [address] [phone] (toll-free) DVDEnthusiasts.nl  Alzheimer's Association [address] [place] [address] [phone] (toll-free, 24-hour helpline) [phone] (TDD) CapitalMile.co.nz  Alzheimer's Foundation of [facility] [address], [place] [address] [phone] (toll-free) www.alzfdn.org  [facility] [place] [address] [employer]  [address] [place] [address] [phone] www.alzdiscovery.org  Association for [address], [place] [address] [phone] (toll-free) [place] [address] [phone] (toll-free) www.brightfocus.org/alzheimers  [facility] [address], [place] [address] [phone] www.https://lambert-jackson.net/  Lewy Body Dementia Association [address]  [place] [address] [phone] (toll-free LBD Caregiver Link) www.lbda.[place], [place], [place] [address] [phone] (toll-free) [phone] [facility]) https://carter.com/  National Organization for Rare Disorders [address] [place] [address] [phone]) (toll-free) www.rarediseases.org  The Dementias: Hope Through Research was jointly produced by the [employer] of Neurological Disorders and Stroke (NINDS) and the [employer] on Aging (NIA), both part of the [employer], the [employer] research agency--supporting scientific studies that turn discovery into health. NINDS is the nation's leading funder of research on the brain and nervous system. The NINDS mission is to reduce the burden of neurological disease. For more information and resources, visit MasterBoxes.it [1] or call ([phone]. NIA leads the federal government effort conducting and supporting research on aging and the health and well-being of older people. NIA's Alzheimer's Disease Education and Referral (ADEAR) Center offers information and publications on dementia and caregiving for families, caregivers, and professionals. For more information, visit DVDEnthusiasts.nl [2] or call [phone]. Also available from NIA are publications and information about Alzheimer's disease as well as the booklets Frontotemporal Disorders: Information for Patients, Families, and Caregivers and Lewy Body Dementia: Information for Patients, Families, and Professionals. Source URL: SocialSpecialists.co.nz

## 2020-03-14 ENCOUNTER — Telehealth: Payer: Self-pay | Admitting: *Deleted

## 2020-03-14 NOTE — Telephone Encounter (Signed)
Spoke with daughter, Kennyth Lose who was with patient in his appointment (on DPR) and informed her his blood work so far is normal, still one pending and we will let him know if anything comes back abnormal. She  verbalized understanding, appreciation.

## 2020-03-17 LAB — B12 AND FOLATE PANEL
Folate: >20 ng/mL (ref >3.0)
Vitamin B-12: 1101 pg/mL (ref 232–1245)

## 2020-03-17 LAB — METHYLMALONIC ACID, SERUM: Methylmalonic Acid: 171 nmol/L (ref 0–378)

## 2020-03-17 LAB — HOMOCYSTEINE: Homocysteine: 10.3 umol/L (ref 0.0–19.2)

## 2020-03-17 LAB — VITAMIN B1: Thiamine: 174.8 nmol/L (ref 66.5–200.0)

## 2020-03-17 LAB — VITAMIN D 25 HYDROXY (VIT D DEFICIENCY, FRACTURES): Vit D, 25-Hydroxy: 86.2 ng/mL (ref 30.0–100.0)

## 2020-07-23 DIAGNOSIS — K589 Irritable bowel syndrome without diarrhea: Secondary | ICD-10-CM | POA: Diagnosis not present

## 2020-07-23 DIAGNOSIS — K219 Gastro-esophageal reflux disease without esophagitis: Secondary | ICD-10-CM | POA: Diagnosis not present

## 2020-07-23 DIAGNOSIS — I10 Essential (primary) hypertension: Secondary | ICD-10-CM | POA: Diagnosis not present

## 2020-08-22 DIAGNOSIS — E538 Deficiency of other specified B group vitamins: Secondary | ICD-10-CM | POA: Diagnosis not present

## 2020-08-22 DIAGNOSIS — K219 Gastro-esophageal reflux disease without esophagitis: Secondary | ICD-10-CM | POA: Diagnosis not present

## 2020-08-22 DIAGNOSIS — C61 Malignant neoplasm of prostate: Secondary | ICD-10-CM | POA: Diagnosis not present

## 2020-08-22 DIAGNOSIS — Z0000 Encounter for general adult medical examination without abnormal findings: Secondary | ICD-10-CM | POA: Diagnosis not present

## 2020-08-22 DIAGNOSIS — K589 Irritable bowel syndrome without diarrhea: Secondary | ICD-10-CM | POA: Diagnosis not present

## 2020-08-22 DIAGNOSIS — Z23 Encounter for immunization: Secondary | ICD-10-CM | POA: Diagnosis not present

## 2020-08-22 DIAGNOSIS — I10 Essential (primary) hypertension: Secondary | ICD-10-CM | POA: Diagnosis not present

## 2020-08-22 DIAGNOSIS — R69 Illness, unspecified: Secondary | ICD-10-CM | POA: Diagnosis not present

## 2020-08-22 DIAGNOSIS — Z7189 Other specified counseling: Secondary | ICD-10-CM | POA: Diagnosis not present

## 2020-09-17 ENCOUNTER — Encounter: Payer: Medicare (Managed Care) | Admitting: Adult Health

## 2020-09-19 NOTE — Progress Notes (Signed)
This encounter was created in error - please disregard.

## 2020-09-26 DIAGNOSIS — R69 Illness, unspecified: Secondary | ICD-10-CM | POA: Diagnosis not present

## 2020-09-26 DIAGNOSIS — Z8249 Family history of ischemic heart disease and other diseases of the circulatory system: Secondary | ICD-10-CM | POA: Diagnosis not present

## 2020-09-26 DIAGNOSIS — E0590 Thyrotoxicosis, unspecified without thyrotoxic crisis or storm: Secondary | ICD-10-CM | POA: Diagnosis not present

## 2020-09-26 DIAGNOSIS — N529 Male erectile dysfunction, unspecified: Secondary | ICD-10-CM | POA: Diagnosis not present

## 2020-09-26 DIAGNOSIS — Z803 Family history of malignant neoplasm of breast: Secondary | ICD-10-CM | POA: Diagnosis not present

## 2020-09-26 DIAGNOSIS — K2970 Gastritis, unspecified, without bleeding: Secondary | ICD-10-CM | POA: Diagnosis not present

## 2020-09-26 DIAGNOSIS — Z823 Family history of stroke: Secondary | ICD-10-CM | POA: Diagnosis not present

## 2020-09-26 DIAGNOSIS — K219 Gastro-esophageal reflux disease without esophagitis: Secondary | ICD-10-CM | POA: Diagnosis not present

## 2020-09-26 DIAGNOSIS — R030 Elevated blood-pressure reading, without diagnosis of hypertension: Secondary | ICD-10-CM | POA: Diagnosis not present

## 2020-09-26 DIAGNOSIS — Z8546 Personal history of malignant neoplasm of prostate: Secondary | ICD-10-CM | POA: Diagnosis not present

## 2020-10-01 ENCOUNTER — Emergency Department (HOSPITAL_COMMUNITY): Payer: Medicare HMO

## 2020-10-01 ENCOUNTER — Inpatient Hospital Stay (HOSPITAL_COMMUNITY)
Admission: EM | Admit: 2020-10-01 | Discharge: 2020-11-15 | DRG: 064 | Disposition: E | Payer: Medicare HMO | Attending: Pulmonary Disease | Admitting: Pulmonary Disease

## 2020-10-01 ENCOUNTER — Encounter (HOSPITAL_COMMUNITY): Payer: Self-pay | Admitting: Internal Medicine

## 2020-10-01 DIAGNOSIS — R042 Hemoptysis: Secondary | ICD-10-CM | POA: Diagnosis not present

## 2020-10-01 DIAGNOSIS — Z515 Encounter for palliative care: Secondary | ICD-10-CM

## 2020-10-01 DIAGNOSIS — R7881 Bacteremia: Secondary | ICD-10-CM | POA: Diagnosis not present

## 2020-10-01 DIAGNOSIS — E785 Hyperlipidemia, unspecified: Secondary | ICD-10-CM | POA: Diagnosis present

## 2020-10-01 DIAGNOSIS — Z4659 Encounter for fitting and adjustment of other gastrointestinal appliance and device: Secondary | ICD-10-CM

## 2020-10-01 DIAGNOSIS — R6521 Severe sepsis with septic shock: Secondary | ICD-10-CM | POA: Diagnosis not present

## 2020-10-01 DIAGNOSIS — R069 Unspecified abnormalities of breathing: Secondary | ICD-10-CM

## 2020-10-01 DIAGNOSIS — F32A Depression, unspecified: Secondary | ICD-10-CM | POA: Diagnosis present

## 2020-10-01 DIAGNOSIS — I4819 Other persistent atrial fibrillation: Secondary | ICD-10-CM | POA: Diagnosis present

## 2020-10-01 DIAGNOSIS — G319 Degenerative disease of nervous system, unspecified: Secondary | ICD-10-CM | POA: Diagnosis not present

## 2020-10-01 DIAGNOSIS — R339 Retention of urine, unspecified: Secondary | ICD-10-CM | POA: Diagnosis not present

## 2020-10-01 DIAGNOSIS — R34 Anuria and oliguria: Secondary | ICD-10-CM | POA: Diagnosis present

## 2020-10-01 DIAGNOSIS — Z66 Do not resuscitate: Secondary | ICD-10-CM | POA: Diagnosis present

## 2020-10-01 DIAGNOSIS — Z87891 Personal history of nicotine dependence: Secondary | ICD-10-CM

## 2020-10-01 DIAGNOSIS — A411 Sepsis due to other specified staphylococcus: Secondary | ICD-10-CM | POA: Diagnosis not present

## 2020-10-01 DIAGNOSIS — R000 Tachycardia, unspecified: Secondary | ICD-10-CM | POA: Diagnosis not present

## 2020-10-01 DIAGNOSIS — Z978 Presence of other specified devices: Secondary | ICD-10-CM

## 2020-10-01 DIAGNOSIS — I10 Essential (primary) hypertension: Secondary | ICD-10-CM | POA: Diagnosis present

## 2020-10-01 DIAGNOSIS — E162 Hypoglycemia, unspecified: Secondary | ICD-10-CM | POA: Diagnosis not present

## 2020-10-01 DIAGNOSIS — J9600 Acute respiratory failure, unspecified whether with hypoxia or hypercapnia: Secondary | ICD-10-CM

## 2020-10-01 DIAGNOSIS — F0390 Unspecified dementia without behavioral disturbance: Secondary | ICD-10-CM | POA: Diagnosis present

## 2020-10-01 DIAGNOSIS — J9690 Respiratory failure, unspecified, unspecified whether with hypoxia or hypercapnia: Secondary | ICD-10-CM

## 2020-10-01 DIAGNOSIS — S0081XA Abrasion of other part of head, initial encounter: Secondary | ICD-10-CM | POA: Diagnosis present

## 2020-10-01 DIAGNOSIS — I480 Paroxysmal atrial fibrillation: Secondary | ICD-10-CM | POA: Diagnosis present

## 2020-10-01 DIAGNOSIS — S0003XA Contusion of scalp, initial encounter: Secondary | ICD-10-CM | POA: Diagnosis not present

## 2020-10-01 DIAGNOSIS — I63433 Cerebral infarction due to embolism of bilateral posterior cerebral arteries: Secondary | ICD-10-CM

## 2020-10-01 DIAGNOSIS — I6622 Occlusion and stenosis of left posterior cerebral artery: Secondary | ICD-10-CM | POA: Diagnosis not present

## 2020-10-01 DIAGNOSIS — N179 Acute kidney failure, unspecified: Secondary | ICD-10-CM | POA: Diagnosis not present

## 2020-10-01 DIAGNOSIS — J15211 Pneumonia due to Methicillin susceptible Staphylococcus aureus: Secondary | ICD-10-CM | POA: Diagnosis not present

## 2020-10-01 DIAGNOSIS — R410 Disorientation, unspecified: Secondary | ICD-10-CM | POA: Diagnosis not present

## 2020-10-01 DIAGNOSIS — Z20822 Contact with and (suspected) exposure to covid-19: Secondary | ICD-10-CM | POA: Diagnosis present

## 2020-10-01 DIAGNOSIS — R4701 Aphasia: Secondary | ICD-10-CM | POA: Diagnosis present

## 2020-10-01 DIAGNOSIS — R404 Transient alteration of awareness: Secondary | ICD-10-CM | POA: Diagnosis not present

## 2020-10-01 DIAGNOSIS — I4891 Unspecified atrial fibrillation: Secondary | ICD-10-CM | POA: Diagnosis not present

## 2020-10-01 DIAGNOSIS — I454 Nonspecific intraventricular block: Secondary | ICD-10-CM | POA: Diagnosis present

## 2020-10-01 DIAGNOSIS — Z8249 Family history of ischemic heart disease and other diseases of the circulatory system: Secondary | ICD-10-CM

## 2020-10-01 DIAGNOSIS — R0689 Other abnormalities of breathing: Secondary | ICD-10-CM | POA: Diagnosis not present

## 2020-10-01 DIAGNOSIS — S06890A Other specified intracranial injury without loss of consciousness, initial encounter: Secondary | ICD-10-CM | POA: Diagnosis not present

## 2020-10-01 DIAGNOSIS — I639 Cerebral infarction, unspecified: Secondary | ICD-10-CM | POA: Diagnosis present

## 2020-10-01 DIAGNOSIS — I63432 Cerebral infarction due to embolism of left posterior cerebral artery: Principal | ICD-10-CM | POA: Diagnosis present

## 2020-10-01 DIAGNOSIS — G4700 Insomnia, unspecified: Secondary | ICD-10-CM | POA: Diagnosis present

## 2020-10-01 DIAGNOSIS — E870 Hyperosmolality and hypernatremia: Secondary | ICD-10-CM | POA: Diagnosis not present

## 2020-10-01 DIAGNOSIS — Y92488 Other paved roadways as the place of occurrence of the external cause: Secondary | ICD-10-CM

## 2020-10-01 DIAGNOSIS — E875 Hyperkalemia: Secondary | ICD-10-CM | POA: Diagnosis not present

## 2020-10-01 DIAGNOSIS — R471 Dysarthria and anarthria: Secondary | ICD-10-CM | POA: Diagnosis present

## 2020-10-01 DIAGNOSIS — Z8546 Personal history of malignant neoplasm of prostate: Secondary | ICD-10-CM

## 2020-10-01 DIAGNOSIS — G9341 Metabolic encephalopathy: Secondary | ICD-10-CM | POA: Diagnosis present

## 2020-10-01 DIAGNOSIS — S2221XA Fracture of manubrium, initial encounter for closed fracture: Secondary | ICD-10-CM | POA: Diagnosis present

## 2020-10-01 DIAGNOSIS — D6489 Other specified anemias: Secondary | ICD-10-CM | POA: Diagnosis present

## 2020-10-01 DIAGNOSIS — I712 Thoracic aortic aneurysm, without rupture: Secondary | ICD-10-CM | POA: Diagnosis present

## 2020-10-01 DIAGNOSIS — T380X5A Adverse effect of glucocorticoids and synthetic analogues, initial encounter: Secondary | ICD-10-CM | POA: Diagnosis not present

## 2020-10-01 DIAGNOSIS — S12401A Unspecified nondisplaced fracture of fifth cervical vertebra, initial encounter for closed fracture: Secondary | ICD-10-CM | POA: Diagnosis not present

## 2020-10-01 DIAGNOSIS — D638 Anemia in other chronic diseases classified elsewhere: Secondary | ICD-10-CM | POA: Diagnosis present

## 2020-10-01 DIAGNOSIS — L8990 Pressure ulcer of unspecified site, unspecified stage: Secondary | ICD-10-CM | POA: Diagnosis not present

## 2020-10-01 DIAGNOSIS — R739 Hyperglycemia, unspecified: Secondary | ICD-10-CM | POA: Diagnosis not present

## 2020-10-01 DIAGNOSIS — E0591 Thyrotoxicosis, unspecified with thyrotoxic crisis or storm: Secondary | ICD-10-CM | POA: Diagnosis present

## 2020-10-01 DIAGNOSIS — D62 Acute posthemorrhagic anemia: Secondary | ICD-10-CM | POA: Diagnosis not present

## 2020-10-01 DIAGNOSIS — S12490A Other displaced fracture of fifth cervical vertebra, initial encounter for closed fracture: Secondary | ICD-10-CM | POA: Diagnosis present

## 2020-10-01 DIAGNOSIS — J189 Pneumonia, unspecified organism: Secondary | ICD-10-CM

## 2020-10-01 DIAGNOSIS — J9601 Acute respiratory failure with hypoxia: Secondary | ICD-10-CM | POA: Diagnosis not present

## 2020-10-01 DIAGNOSIS — R4182 Altered mental status, unspecified: Secondary | ICD-10-CM | POA: Diagnosis not present

## 2020-10-01 DIAGNOSIS — Z79899 Other long term (current) drug therapy: Secondary | ICD-10-CM

## 2020-10-01 DIAGNOSIS — Z781 Physical restraint status: Secondary | ICD-10-CM

## 2020-10-01 DIAGNOSIS — E538 Deficiency of other specified B group vitamins: Secondary | ICD-10-CM | POA: Diagnosis present

## 2020-10-01 DIAGNOSIS — Z800 Family history of malignant neoplasm of digestive organs: Secondary | ICD-10-CM

## 2020-10-01 DIAGNOSIS — Z041 Encounter for examination and observation following transport accident: Secondary | ICD-10-CM | POA: Diagnosis not present

## 2020-10-01 DIAGNOSIS — R0902 Hypoxemia: Secondary | ICD-10-CM | POA: Diagnosis not present

## 2020-10-01 DIAGNOSIS — I63531 Cerebral infarction due to unspecified occlusion or stenosis of right posterior cerebral artery: Secondary | ICD-10-CM | POA: Diagnosis present

## 2020-10-01 DIAGNOSIS — S12400A Unspecified displaced fracture of fifth cervical vertebra, initial encounter for closed fracture: Secondary | ICD-10-CM | POA: Diagnosis not present

## 2020-10-01 DIAGNOSIS — Z803 Family history of malignant neoplasm of breast: Secondary | ICD-10-CM

## 2020-10-01 DIAGNOSIS — G9340 Encephalopathy, unspecified: Secondary | ICD-10-CM | POA: Diagnosis present

## 2020-10-01 DIAGNOSIS — E8770 Fluid overload, unspecified: Secondary | ICD-10-CM | POA: Diagnosis not present

## 2020-10-01 DIAGNOSIS — S065X0A Traumatic subdural hemorrhage without loss of consciousness, initial encounter: Secondary | ICD-10-CM | POA: Diagnosis not present

## 2020-10-01 DIAGNOSIS — R079 Chest pain, unspecified: Secondary | ICD-10-CM | POA: Diagnosis not present

## 2020-10-01 DIAGNOSIS — E440 Moderate protein-calorie malnutrition: Secondary | ICD-10-CM | POA: Diagnosis present

## 2020-10-01 DIAGNOSIS — R69 Illness, unspecified: Secondary | ICD-10-CM | POA: Diagnosis not present

## 2020-10-01 DIAGNOSIS — R29721 NIHSS score 21: Secondary | ICD-10-CM | POA: Diagnosis present

## 2020-10-01 DIAGNOSIS — R1310 Dysphagia, unspecified: Secondary | ICD-10-CM | POA: Diagnosis present

## 2020-10-01 LAB — CBC
HCT: 43.4 % (ref 39.0–52.0)
Hemoglobin: 14.3 g/dL (ref 13.0–17.0)
MCH: 29.2 pg (ref 26.0–34.0)
MCHC: 32.9 g/dL (ref 30.0–36.0)
MCV: 88.8 fL (ref 80.0–100.0)
Platelets: 203 10*3/uL (ref 150–400)
RBC: 4.89 MIL/uL (ref 4.22–5.81)
RDW: 13.3 % (ref 11.5–15.5)
WBC: 9.2 10*3/uL (ref 4.0–10.5)
nRBC: 0 % (ref 0.0–0.2)

## 2020-10-01 LAB — I-STAT CHEM 8, ED
BUN: 21 mg/dL (ref 8–23)
Calcium, Ion: 1.09 mmol/L — ABNORMAL LOW (ref 1.15–1.40)
Chloride: 108 mmol/L (ref 98–111)
Creatinine, Ser: 0.9 mg/dL (ref 0.61–1.24)
Glucose, Bld: 100 mg/dL — ABNORMAL HIGH (ref 70–99)
HCT: 46 % (ref 39.0–52.0)
Hemoglobin: 15.6 g/dL (ref 13.0–17.0)
Potassium: 3.7 mmol/L (ref 3.5–5.1)
Sodium: 141 mmol/L (ref 135–145)
TCO2: 19 mmol/L — ABNORMAL LOW (ref 22–32)

## 2020-10-01 LAB — COMPREHENSIVE METABOLIC PANEL
ALT: 17 U/L (ref 0–44)
AST: 30 U/L (ref 15–41)
Albumin: 3.7 g/dL (ref 3.5–5.0)
Alkaline Phosphatase: 78 U/L (ref 38–126)
Anion gap: 15 (ref 5–15)
BUN: 18 mg/dL (ref 8–23)
CO2: 17 mmol/L — ABNORMAL LOW (ref 22–32)
Calcium: 9.5 mg/dL (ref 8.9–10.3)
Chloride: 107 mmol/L (ref 98–111)
Creatinine, Ser: 1.18 mg/dL (ref 0.61–1.24)
GFR, Estimated: >60 mL/min (ref >60)
Glucose, Bld: 102 mg/dL — ABNORMAL HIGH (ref 70–99)
Potassium: 3.9 mmol/L (ref 3.5–5.1)
Sodium: 139 mmol/L (ref 135–145)
Total Bilirubin: 2.3 mg/dL — ABNORMAL HIGH (ref 0.3–1.2)
Total Protein: 7.1 g/dL (ref 6.5–8.1)

## 2020-10-01 LAB — I-STAT ARTERIAL BLOOD GAS, ED
Acid-base deficit: 2 mmol/L (ref 0.0–2.0)
Bicarbonate: 21.6 mmol/L (ref 20.0–28.0)
Calcium, Ion: 1.24 mmol/L (ref 1.15–1.40)
HCT: 41 % (ref 39.0–52.0)
Hemoglobin: 13.9 g/dL (ref 13.0–17.0)
O2 Saturation: 94 %
Patient temperature: 99.7
Potassium: 3.8 mmol/L (ref 3.5–5.1)
Sodium: 141 mmol/L (ref 135–145)
TCO2: 23 mmol/L (ref 22–32)
pCO2 arterial: 35.2 mmHg (ref 32.0–48.0)
pH, Arterial: 7.398 (ref 7.350–7.450)
pO2, Arterial: 74 mmHg — ABNORMAL LOW (ref 83.0–108.0)

## 2020-10-01 LAB — URINALYSIS, ROUTINE W REFLEX MICROSCOPIC
Bacteria, UA: NONE SEEN
Bilirubin Urine: NEGATIVE
Glucose, UA: NEGATIVE mg/dL
Hgb urine dipstick: NEGATIVE
Ketones, ur: 20 mg/dL — AB
Nitrite: NEGATIVE
Protein, ur: NEGATIVE mg/dL
Specific Gravity, Urine: 1.015 (ref 1.005–1.030)
pH: 7 (ref 5.0–8.0)

## 2020-10-01 LAB — ETHANOL: Alcohol, Ethyl (B): <10 mg/dL (ref <10)

## 2020-10-01 LAB — SAMPLE TO BLOOD BANK

## 2020-10-01 LAB — TSH: TSH: <0.01 u[IU]/mL — ABNORMAL LOW (ref 0.350–4.500)

## 2020-10-01 LAB — RESP PANEL BY RT-PCR (FLU A&B, COVID) ARPGX2
Influenza A by PCR: NEGATIVE
Influenza B by PCR: NEGATIVE
SARS Coronavirus 2 by RT PCR: NEGATIVE

## 2020-10-01 LAB — TROPONIN I (HIGH SENSITIVITY): Troponin I (High Sensitivity): 14 ng/L (ref <18)

## 2020-10-01 LAB — CBG MONITORING, ED: Glucose-Capillary: 85 mg/dL (ref 70–99)

## 2020-10-01 LAB — CK: Total CK: 131 U/L (ref 49–397)

## 2020-10-01 LAB — PROTIME-INR
INR: 1.1 (ref 0.8–1.2)
Prothrombin Time: 14 seconds (ref 11.4–15.2)

## 2020-10-01 LAB — LACTIC ACID, PLASMA: Lactic Acid, Venous: 5.8 mmol/L (ref 0.5–1.9)

## 2020-10-01 MED ORDER — SODIUM CHLORIDE 0.9 % IV SOLN
8.0000 mg/h | INTRAVENOUS | Status: DC
  Administered 2020-10-02: 8 mg/h via INTRAVENOUS
  Filled 2020-10-01 (×2): qty 80

## 2020-10-01 MED ORDER — LORAZEPAM 2 MG/ML IJ SOLN
0.5000 mg | Freq: Once | INTRAMUSCULAR | Status: AC
  Administered 2020-10-01: 0.5 mg via INTRAVENOUS
  Filled 2020-10-01: qty 1

## 2020-10-01 MED ORDER — FENTANYL CITRATE (PF) 100 MCG/2ML IJ SOLN
50.0000 ug | Freq: Once | INTRAMUSCULAR | Status: AC
  Administered 2020-10-01: 50 ug via INTRAVENOUS
  Filled 2020-10-01: qty 2

## 2020-10-01 MED ORDER — IOHEXOL 300 MG/ML  SOLN
100.0000 mL | Freq: Once | INTRAMUSCULAR | Status: AC | PRN
  Administered 2020-10-01: 100 mL via INTRAVENOUS

## 2020-10-01 MED ORDER — SODIUM CHLORIDE 0.9 % IV SOLN
80.0000 mg | Freq: Once | INTRAVENOUS | Status: AC
  Administered 2020-10-01: 80 mg via INTRAVENOUS
  Filled 2020-10-01: qty 80

## 2020-10-01 MED ORDER — DILTIAZEM LOAD VIA INFUSION: 15.0000 mg | Freq: Once | INTRAVENOUS | Status: DC

## 2020-10-01 MED ORDER — PROPRANOLOL HCL 1 MG/ML IV SOLN: 1.0000 mg | Freq: Once | INTRAVENOUS | Status: DC

## 2020-10-01 MED ORDER — MIDAZOLAM HCL 2 MG/2ML IJ SOLN
INTRAMUSCULAR | Status: AC
  Administered 2020-10-01: 2 mg
  Filled 2020-10-01: qty 2

## 2020-10-01 MED ORDER — FENTANYL CITRATE (PF) 100 MCG/2ML IJ SOLN
25.0000 ug | Freq: Once | INTRAMUSCULAR | Status: AC
  Administered 2020-10-01: 25 ug via INTRAVENOUS
  Filled 2020-10-01: qty 2

## 2020-10-01 MED ORDER — SODIUM CHLORIDE 0.9 % IV BOLUS
1000.0000 mL | Freq: Once | INTRAVENOUS | Status: AC
  Administered 2020-10-01: 1000 mL via INTRAVENOUS

## 2020-10-01 MED ORDER — LORAZEPAM 2 MG/ML IJ SOLN: 1.0000 mg | Freq: Once | INTRAMUSCULAR | Status: DC

## 2020-10-01 MED ORDER — DILTIAZEM HCL 25 MG/5ML IV SOLN
15.0000 mg | Freq: Once | INTRAVENOUS | Status: AC
  Administered 2020-10-01: 15 mg via INTRAVENOUS
  Filled 2020-10-01: qty 5
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
## 2020-10-01 NOTE — Progress Notes (Signed)
   10/08/2020 1900  Clinical Encounter Type  Visited With Patient not available  Visit Type Trauma  Referral From Nurse  Consult/Referral To Chaplain  The chaplain responded to trauma page. No family is present with the patient. The patient is confused. The patient is unavailable and being assessed by medical staff. The chaplain will follow up as needed.

## 2020-10-01 NOTE — Consult Note (Incomplete)
Neurology Consultation Reason for Consult: *** Requesting Physician: ***  CC: ***  History is obtained from:***  HPI: Samuel Barry is a 75 y.o. male    LKW: *** tPA given?: No, or if yes, time given *** IA performed?: No, or if yes, groin puncture time: *** Premorbid modified rankin scale: ***     0 - No symptoms.     1 - No significant disability. Able to carry out all usual activities, despite some symptoms.     2 - Slight disability. Able to look after own affairs without assistance, but unable to carry out all previous activities.     3 - Moderate disability. Requires some help, but able to walk unassisted.     4 - Moderately severe disability. Unable to attend to own bodily needs without assistance, and unable to walk unassisted.     5 - Severe disability. Requires constant nursing care and attention, bedridden, incontinent.     6 - Dead. ICH Score: ***  Time performed: *** GCS: {Blank single:19197::"3-4 is 2 points","5-12 is 1 point","13-15 is 0 points"} Infratentorial: {yes no:314532}. If yes, 1 point Volume: {Blank single:19197::">30cc is 1 point","<30cc is 0 points"}  Age: 75 y.o.. >80 is 1 point Intraventricular extension is 1 point  Score:***  A Score of {Blank single:19197::"0 points has a 30 day mortality of 0%","1 points has a 30 day mortality of 13%","2 points has a 30 day mortality of 26%","3 points has a 30 day mortality of 72%","4 points has a 30 day mortality of 97%","5-6 points has a 30 day mortality of 100%"}. Stroke. 2001 Apr;32(4):891-7.    ROS: All other review of systems was negative except as noted in the HPI. *** Unable to obtain due to altered mental status.   Past Medical History:  Diagnosis Date  . Allergic rhinitis    per [facility] notes  . Allergy   . Aortic ectasia [facility])    per [facility] notes  . Chronic pancreatitis ([place])   . Constipation    per [facility] notes  . Dementia ([place])   . Depression   . History of  colon polyps    per [facility] notes  . Hypertension   . Hyperthyroidism    per [facility] notes  . Kidney stones   . Memory loss   . Prostate cancer ([place]) 2005  . Vitamin D deficiency    per [facility] notes   ***  Family History  Problem Relation Age of Onset  . Colon cancer Mother 67  . Breast cancer Mother   . Heart attack Father   . Dementia Other   . Stomach cancer Neg Hx    ***  Social History:  reports that he quit smoking about 57 years ago. He has never used smokeless tobacco. He reports previous alcohol use. He reports that he does not use drugs. ***  Exam: Current vital signs: BP (!) 164/69   Pulse (!) 132   Temp 99.2 F (37.3 C) (Rectal)   Resp (!) 31   SpO2 100%  Vital signs in last 24 hours: Temp:  [99.2 F (37.3 C)] 99.2 F (37.3 C) (05/17 2128) Pulse Rate:  [61-132] 132 (05/17 2230) Resp:  [18-40] 31 (05/17 2230) BP: (150-186)/(69-144) 164/69 (05/17 2230) SpO2:  [99 %-100 %] 100 % (05/17 2230)   Physical Exam  Constitutional: Appears well-developed and well-nourished.  Psych: Affect appropriate to situation, *** Eyes: No scleral injection HENT:  No oropharyngeal obstruction.  MSK: no joint deformities.  Cardiovascular: Normal rate and regular rhythm.  Respiratory: Effort normal, non-labored breathing GI: Soft.  No distension. There is no tenderness.  Skin: Warm dry and intact visible skin  Neuro: Mental Status: Patient is awake, alert, oriented to person, place, month, year, and situation.*** Patient is able to give a clear and coherent history.*** No signs of aphasia or neglect*** Cranial Nerves: II: Visual Fields are full. Pupils are equal, round, and reactive to light.  *** III,IV, VI: EOMI without ptosis or diploplia.  V: Facial sensation is symmetric to temperature VII: Facial movement is symmetric.  VIII: hearing is intact to voice X: Uvula elevates symmetrically XI: Shoulder shrug is symmetric. XII: tongue  is midline without atrophy or fasciculations.  Motor: Tone is normal. Bulk is normal. 5/5 strength was present in all four extremities. *** Sensory: Sensation is symmetric to light touch and temperature in the arms and legs.*** Deep Tendon Reflexes: 2+ and symmetric in the biceps and patellae. *** Plantars: Toes are downgoing bilaterally. *** Cerebellar: FNF and HKS are intact bilaterally***  NIHSS total *** Score breakdown: ***    I have reviewed labs in epic and the results pertinent to this consultation are: ***  I have reviewed the images obtained:***   Impression: ***  Recommendations: - ***   Lesleigh Noe MD-PhD [facility] [phone]   *** [facility], [facility], Teleneuro    Total critical care time: *** minutes   Critical care time was exclusive of separately billable procedures and treating other patients.   Critical care was necessary to treat or prevent imminent or life-threatening deterioration.   Critical care was time spent personally by me on the following activities: development of treatment plan with patient and/or surrogate as well as nursing, discussions with consultants/primary team, evaluation of patient's response to treatment, examination of patient, obtaining history from patient or surrogate, ordering and performing treatments and interventions, ordering and review of laboratory studies, ordering and review of radiographic studies, and re-evaluation of patient's condition as needed, as documented above.
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
## 2020-10-01 NOTE — ED Notes (Signed)
Blood noticed around penis after urinating.
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
## 2020-10-01 NOTE — Consult Note (Addendum)
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 acute traumatic injury to the  abdomen, or pelvis. 3. Ascending thoracic aortic aneurysm with the aortic root measuring 5.2 cm, ascending aorta measuring 4.2 cm and aortic arch measuring 3.7 cm in maximal diameter. Recommend semi-annual imaging followup by CTA or MRA and referral to cardiothoracic surgery if not already obtained. This recommendation follows 2010 ACCF/AHA/AATS/ACR/ASA/SCA/SCAI/SIR/STS/SVM Guidelines for the Diagnosis and Management of Patients With Thoracic Aortic Disease. Circulation. 2010; 121: Z610-R604. 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 4. Nonobstructive right nephrolithiasis.  5. Colonic diverticulosis without findings of acute diverticulitis. 6. Aortic atherosclerosis. 7. Increased size of the intermediate density left renal lesion previously characterized as a hemorrhagic cyst on MRI May 10, 2018. Aortic Atherosclerosis (ICD10-I70.0). Electronically Signed   By: Dahlia Bailiff MD   On:

## 2020-10-02 ENCOUNTER — Inpatient Hospital Stay (HOSPITAL_COMMUNITY): Payer: Medicare HMO

## 2020-10-02 ENCOUNTER — Emergency Department (HOSPITAL_COMMUNITY): Payer: Medicare HMO

## 2020-10-02 ENCOUNTER — Other Ambulatory Visit: Payer: Self-pay

## 2020-10-02 DIAGNOSIS — I6389 Other cerebral infarction: Secondary | ICD-10-CM

## 2020-10-02 DIAGNOSIS — S12400A Unspecified displaced fracture of fifth cervical vertebra, initial encounter for closed fracture: Secondary | ICD-10-CM | POA: Diagnosis not present

## 2020-10-02 DIAGNOSIS — I639 Cerebral infarction, unspecified: Secondary | ICD-10-CM | POA: Diagnosis present

## 2020-10-02 DIAGNOSIS — I63433 Cerebral infarction due to embolism of bilateral posterior cerebral arteries: Secondary | ICD-10-CM | POA: Diagnosis not present

## 2020-10-02 DIAGNOSIS — Z515 Encounter for palliative care: Secondary | ICD-10-CM | POA: Diagnosis not present

## 2020-10-02 DIAGNOSIS — R7881 Bacteremia: Secondary | ICD-10-CM | POA: Diagnosis not present

## 2020-10-02 DIAGNOSIS — E0591 Thyrotoxicosis, unspecified with thyrotoxic crisis or storm: Secondary | ICD-10-CM | POA: Diagnosis present

## 2020-10-02 DIAGNOSIS — I10 Essential (primary) hypertension: Secondary | ICD-10-CM | POA: Diagnosis present

## 2020-10-02 DIAGNOSIS — B9562 Methicillin resistant Staphylococcus aureus infection as the cause of diseases classified elsewhere: Secondary | ICD-10-CM | POA: Diagnosis not present

## 2020-10-02 DIAGNOSIS — F32A Depression, unspecified: Secondary | ICD-10-CM | POA: Diagnosis present

## 2020-10-02 DIAGNOSIS — I6300 Cerebral infarction due to thrombosis of unspecified precerebral artery: Secondary | ICD-10-CM | POA: Diagnosis not present

## 2020-10-02 DIAGNOSIS — S12490A Other displaced fracture of fifth cervical vertebra, initial encounter for closed fracture: Secondary | ICD-10-CM | POA: Diagnosis present

## 2020-10-02 DIAGNOSIS — N170 Acute kidney failure with tubular necrosis: Secondary | ICD-10-CM | POA: Diagnosis not present

## 2020-10-02 DIAGNOSIS — Z20822 Contact with and (suspected) exposure to covid-19: Secondary | ICD-10-CM | POA: Diagnosis not present

## 2020-10-02 DIAGNOSIS — G319 Degenerative disease of nervous system, unspecified: Secondary | ICD-10-CM | POA: Diagnosis not present

## 2020-10-02 DIAGNOSIS — I63532 Cerebral infarction due to unspecified occlusion or stenosis of left posterior cerebral artery: Secondary | ICD-10-CM | POA: Diagnosis not present

## 2020-10-02 DIAGNOSIS — Z4682 Encounter for fitting and adjustment of non-vascular catheter: Secondary | ICD-10-CM | POA: Diagnosis not present

## 2020-10-02 DIAGNOSIS — Z978 Presence of other specified devices: Secondary | ICD-10-CM | POA: Diagnosis not present

## 2020-10-02 DIAGNOSIS — Z7189 Other specified counseling: Secondary | ICD-10-CM | POA: Diagnosis not present

## 2020-10-02 DIAGNOSIS — J9811 Atelectasis: Secondary | ICD-10-CM | POA: Diagnosis not present

## 2020-10-02 DIAGNOSIS — J9600 Acute respiratory failure, unspecified whether with hypoxia or hypercapnia: Secondary | ICD-10-CM | POA: Diagnosis not present

## 2020-10-02 DIAGNOSIS — E870 Hyperosmolality and hypernatremia: Secondary | ICD-10-CM | POA: Diagnosis not present

## 2020-10-02 DIAGNOSIS — I480 Paroxysmal atrial fibrillation: Secondary | ICD-10-CM | POA: Diagnosis not present

## 2020-10-02 DIAGNOSIS — N19 Unspecified kidney failure: Secondary | ICD-10-CM | POA: Diagnosis not present

## 2020-10-02 DIAGNOSIS — A419 Sepsis, unspecified organism: Secondary | ICD-10-CM | POA: Diagnosis not present

## 2020-10-02 DIAGNOSIS — I63531 Cerebral infarction due to unspecified occlusion or stenosis of right posterior cerebral artery: Secondary | ICD-10-CM | POA: Diagnosis present

## 2020-10-02 DIAGNOSIS — D638 Anemia in other chronic diseases classified elsewhere: Secondary | ICD-10-CM | POA: Diagnosis present

## 2020-10-02 DIAGNOSIS — R4182 Altered mental status, unspecified: Secondary | ICD-10-CM | POA: Diagnosis not present

## 2020-10-02 DIAGNOSIS — G9340 Encephalopathy, unspecified: Secondary | ICD-10-CM | POA: Diagnosis not present

## 2020-10-02 DIAGNOSIS — A411 Sepsis due to other specified staphylococcus: Secondary | ICD-10-CM | POA: Diagnosis not present

## 2020-10-02 DIAGNOSIS — I63032 Cerebral infarction due to thrombosis of left carotid artery: Secondary | ICD-10-CM | POA: Diagnosis not present

## 2020-10-02 DIAGNOSIS — S2221XA Fracture of manubrium, initial encounter for closed fracture: Secondary | ICD-10-CM | POA: Diagnosis present

## 2020-10-02 DIAGNOSIS — S069X0A Unspecified intracranial injury without loss of consciousness, initial encounter: Secondary | ICD-10-CM | POA: Diagnosis not present

## 2020-10-02 DIAGNOSIS — Z041 Encounter for examination and observation following transport accident: Secondary | ICD-10-CM | POA: Diagnosis not present

## 2020-10-02 DIAGNOSIS — J15211 Pneumonia due to Methicillin susceptible Staphylococcus aureus: Secondary | ICD-10-CM | POA: Diagnosis not present

## 2020-10-02 DIAGNOSIS — I4819 Other persistent atrial fibrillation: Secondary | ICD-10-CM | POA: Diagnosis present

## 2020-10-02 DIAGNOSIS — R069 Unspecified abnormalities of breathing: Secondary | ICD-10-CM | POA: Diagnosis not present

## 2020-10-02 DIAGNOSIS — E440 Moderate protein-calorie malnutrition: Secondary | ICD-10-CM | POA: Diagnosis not present

## 2020-10-02 DIAGNOSIS — F0390 Unspecified dementia without behavioral disturbance: Secondary | ICD-10-CM | POA: Diagnosis present

## 2020-10-02 DIAGNOSIS — I517 Cardiomegaly: Secondary | ICD-10-CM | POA: Diagnosis not present

## 2020-10-02 DIAGNOSIS — D62 Acute posthemorrhagic anemia: Secondary | ICD-10-CM | POA: Diagnosis not present

## 2020-10-02 DIAGNOSIS — R410 Disorientation, unspecified: Secondary | ICD-10-CM | POA: Diagnosis not present

## 2020-10-02 DIAGNOSIS — R4701 Aphasia: Secondary | ICD-10-CM | POA: Diagnosis present

## 2020-10-02 DIAGNOSIS — I712 Thoracic aortic aneurysm, without rupture: Secondary | ICD-10-CM | POA: Diagnosis present

## 2020-10-02 DIAGNOSIS — I6622 Occlusion and stenosis of left posterior cerebral artery: Secondary | ICD-10-CM | POA: Diagnosis not present

## 2020-10-02 DIAGNOSIS — S12401A Unspecified nondisplaced fracture of fifth cervical vertebra, initial encounter for closed fracture: Secondary | ICD-10-CM | POA: Diagnosis not present

## 2020-10-02 DIAGNOSIS — Y92488 Other paved roadways as the place of occurrence of the external cause: Secondary | ICD-10-CM | POA: Diagnosis not present

## 2020-10-02 DIAGNOSIS — J9601 Acute respiratory failure with hypoxia: Secondary | ICD-10-CM | POA: Diagnosis not present

## 2020-10-02 DIAGNOSIS — G9341 Metabolic encephalopathy: Secondary | ICD-10-CM | POA: Diagnosis not present

## 2020-10-02 DIAGNOSIS — S06890A Other specified intracranial injury without loss of consciousness, initial encounter: Secondary | ICD-10-CM | POA: Diagnosis not present

## 2020-10-02 DIAGNOSIS — R34 Anuria and oliguria: Secondary | ICD-10-CM | POA: Diagnosis present

## 2020-10-02 DIAGNOSIS — N179 Acute kidney failure, unspecified: Secondary | ICD-10-CM | POA: Diagnosis not present

## 2020-10-02 DIAGNOSIS — I63432 Cerebral infarction due to embolism of left posterior cerebral artery: Secondary | ICD-10-CM | POA: Diagnosis not present

## 2020-10-02 DIAGNOSIS — Z66 Do not resuscitate: Secondary | ICD-10-CM | POA: Diagnosis not present

## 2020-10-02 DIAGNOSIS — R6521 Severe sepsis with septic shock: Secondary | ICD-10-CM | POA: Diagnosis not present

## 2020-10-02 LAB — BASIC METABOLIC PANEL
Anion gap: 9 (ref 5–15)
BUN: 12 mg/dL (ref 8–23)
CO2: 21 mmol/L — ABNORMAL LOW (ref 22–32)
Calcium: 8.8 mg/dL — ABNORMAL LOW (ref 8.9–10.3)
Chloride: 108 mmol/L (ref 98–111)
Creatinine, Ser: 0.88 mg/dL (ref 0.61–1.24)
GFR, Estimated: >60 mL/min (ref >60)
Glucose, Bld: 92 mg/dL (ref 70–99)
Potassium: 4.1 mmol/L (ref 3.5–5.1)
Sodium: 138 mmol/L (ref 135–145)

## 2020-10-02 LAB — MRSA PCR SCREENING: MRSA by PCR: NEGATIVE

## 2020-10-02 LAB — CBC
HCT: 43.1 % (ref 39.0–52.0)
Hemoglobin: 13.6 g/dL (ref 13.0–17.0)
MCH: 28.6 pg (ref 26.0–34.0)
MCHC: 31.6 g/dL (ref 30.0–36.0)
MCV: 90.7 fL (ref 80.0–100.0)
Platelets: 194 10*3/uL (ref 150–400)
RBC: 4.75 MIL/uL (ref 4.22–5.81)
RDW: 13.7 % (ref 11.5–15.5)
WBC: 8.7 10*3/uL (ref 4.0–10.5)
nRBC: 0 % (ref 0.0–0.2)

## 2020-10-02 LAB — PHOSPHORUS
Phosphorus: 3.6 mg/dL (ref 2.5–4.6)
Phosphorus: 2.9 mg/dL (ref 2.5–4.6)
Phosphorus: 3.3 mg/dL (ref 2.5–4.6)

## 2020-10-02 LAB — GLUCOSE, CAPILLARY
Glucose-Capillary: 106 mg/dL — ABNORMAL HIGH (ref 70–99)
Glucose-Capillary: 112 mg/dL — ABNORMAL HIGH (ref 70–99)
Glucose-Capillary: 108 mg/dL — ABNORMAL HIGH (ref 70–99)
Glucose-Capillary: 138 mg/dL — ABNORMAL HIGH (ref 70–99)
Glucose-Capillary: 154 mg/dL — ABNORMAL HIGH (ref 70–99)

## 2020-10-02 LAB — LIPID PANEL
Cholesterol: 122 mg/dL (ref 0–200)
HDL: 50 mg/dL (ref >40)
LDL Cholesterol: 65 mg/dL (ref 0–99)
Total CHOL/HDL Ratio: 2.4 RATIO
Triglycerides: 35 mg/dL (ref <150)
VLDL: 7 mg/dL (ref 0–40)

## 2020-10-02 LAB — POCT I-STAT 7, (LYTES, BLD GAS, ICA,H+H)
Acid-base deficit: 2 mmol/L (ref 0.0–2.0)
Bicarbonate: 23.6 mmol/L (ref 20.0–28.0)
Calcium, Ion: 1.25 mmol/L (ref 1.15–1.40)
HCT: 43 % (ref 39.0–52.0)
Hemoglobin: 14.6 g/dL (ref 13.0–17.0)
O2 Saturation: 96 %
Patient temperature: 100.2
Potassium: 4.3 mmol/L (ref 3.5–5.1)
Sodium: 140 mmol/L (ref 135–145)
TCO2: 25 mmol/L (ref 22–32)
pCO2 arterial: 43.6 mmHg (ref 32.0–48.0)
pH, Arterial: 7.347 — ABNORMAL LOW (ref 7.350–7.450)
pO2, Arterial: 92 mmHg (ref 83.0–108.0)

## 2020-10-02 LAB — ECHOCARDIOGRAM COMPLETE
AR max vel: 3.4 cm2
AV Area VTI: 3.57 cm2
AV Area mean vel: 3.54 cm2
AV Mean grad: 3 mmHg
AV Peak grad: 7.1 mmHg
Ao pk vel: 1.33 m/s
Height: 68.25 in
P 1/2 time: 317 msec
S' Lateral: 3.6 cm
Weight: 2688 oz

## 2020-10-02 LAB — LACTIC ACID, PLASMA: Lactic Acid, Venous: 2.1 mmol/L (ref 0.5–1.9)

## 2020-10-02 LAB — MAGNESIUM
Magnesium: 1.8 mg/dL (ref 1.7–2.4)
Magnesium: 1.9 mg/dL (ref 1.7–2.4)
Magnesium: 2 mg/dL (ref 1.7–2.4)

## 2020-10-02 LAB — CBG MONITORING, ED: Glucose-Capillary: 108 mg/dL — ABNORMAL HIGH (ref 70–99)

## 2020-10-02 LAB — HEMOGLOBIN A1C
Hgb A1c MFr Bld: 5.3 % (ref 4.8–5.6)
Mean Plasma Glucose: 105.41 mg/dL

## 2020-10-02 LAB — T4, FREE: Free T4: 5.2 ng/dL — ABNORMAL HIGH (ref 0.61–1.12)

## 2020-10-02 MED ORDER — PROPYLTHIOURACIL 50 MG PO TABS
200.0000 mg | ORAL_TABLET | ORAL | Status: DC
  Filled 2020-10-02 (×6): qty 4

## 2020-10-02 MED ORDER — IOHEXOL 350 MG/ML SOLN
100.0000 mL | Freq: Once | INTRAVENOUS | Status: AC | PRN
  Administered 2020-10-02: 100 mL via INTRAVENOUS

## 2020-10-02 MED ORDER — PROSOURCE TF PO LIQD
45.0000 mL | Freq: Three times a day (TID) | ORAL | Status: DC
  Administered 2020-10-02 – 2020-10-15 (×37): 45 mL
  Filled 2020-10-02 (×34): qty 45

## 2020-10-02 MED ORDER — ACETAMINOPHEN 650 MG RE SUPP: 650.0000 mg | Freq: Three times a day (TID) | RECTAL | Status: DC | PRN

## 2020-10-02 MED ORDER — METHIMAZOLE 10 MG PO TABS
10.0000 mg | ORAL_TABLET | Freq: Two times a day (BID) | ORAL | Status: DC
  Filled 2020-10-02: qty 1

## 2020-10-02 MED ORDER — CHLORHEXIDINE GLUCONATE CLOTH 2 % EX PADS
6.0000 | MEDICATED_PAD | Freq: Every day | CUTANEOUS | Status: DC
  Administered 2020-10-02 – 2020-10-19 (×17): 6 via TOPICAL

## 2020-10-02 MED ORDER — HYDROCORTISONE NA SUCCINATE PF 100 MG IJ SOLR: 100.0000 mg | Freq: Once | INTRAMUSCULAR | Status: DC

## 2020-10-02 MED ORDER — DOCUSATE SODIUM 100 MG PO CAPS: 100.0000 mg | ORAL_CAPSULE | Freq: Two times a day (BID) | ORAL | Status: DC | PRN

## 2020-10-02 MED ORDER — ESMOLOL HCL-SODIUM CHLORIDE 2000 MG/100ML IV SOLN
25.0000 ug/kg/min | INTRAVENOUS | Status: DC
  Administered 2020-10-02 – 2020-10-03 (×4): 25 ug/kg/min via INTRAVENOUS
  Filled 2020-10-02 (×5): qty 100

## 2020-10-02 MED ORDER — OSMOLITE 1.5 CAL PO LIQD
1000.0000 mL | ORAL | Status: DC
  Administered 2020-10-02 – 2020-10-20 (×20): 1000 mL
  Filled 2020-10-02 (×5): qty 1000

## 2020-10-02 MED ORDER — PROPYLTHIOURACIL 50 MG PO TABS
200.0000 mg | ORAL_TABLET | Freq: Three times a day (TID) | ORAL | Status: DC
  Filled 2020-10-02 (×2): qty 4

## 2020-10-02 MED ORDER — DOCUSATE SODIUM 50 MG/5ML PO LIQD
100.0000 mg | Freq: Two times a day (BID) | ORAL | Status: DC | PRN
  Administered 2020-10-08: 100 mg
  Filled 2020-10-02: qty 10

## 2020-10-02 MED ORDER — ACETAMINOPHEN 650 MG RE SUPP: 650.0000 mg | Freq: Four times a day (QID) | RECTAL | Status: DC | PRN

## 2020-10-02 MED ORDER — PERFLUTREN LIPID MICROSPHERE
1.0000 mL | INTRAVENOUS | Status: AC | PRN
  Administered 2020-10-02: 3 mL via INTRAVENOUS
  Filled 2020-10-02: qty 10

## 2020-10-02 MED ORDER — POLYETHYLENE GLYCOL 3350 17 G PO PACK: 17.0000 g | PACK | Freq: Every day | ORAL | Status: DC | PRN

## 2020-10-02 MED ORDER — PROPRANOLOL HCL 60 MG PO TABS
60.0000 mg | ORAL_TABLET | Freq: Once | ORAL | Status: DC
  Filled 2020-10-02: qty 1

## 2020-10-02 MED ORDER — ACETAMINOPHEN 325 MG PO TABS
650.0000 mg | ORAL_TABLET | Freq: Four times a day (QID) | ORAL | Status: DC | PRN
  Administered 2020-10-05 – 2020-10-20 (×12): 650 mg
  Filled 2020-10-02 (×12): qty 2

## 2020-10-02 MED ORDER — ACETAMINOPHEN 325 MG PO TABS: 650.0000 mg | ORAL_TABLET | Freq: Four times a day (QID) | ORAL | Status: DC | PRN

## 2020-10-02 MED ORDER — PROPYLTHIOURACIL 50 MG PO TABS
200.0000 mg | ORAL_TABLET | ORAL | Status: DC
  Administered 2020-10-02 – 2020-10-05 (×18): 200 mg
  Filled 2020-10-02 (×18): qty 4

## 2020-10-02 MED ORDER — HYDROCORTISONE NA SUCCINATE PF 100 MG IJ SOLR
100.0000 mg | Freq: Three times a day (TID) | INTRAMUSCULAR | Status: DC
  Administered 2020-10-02 – 2020-10-04 (×7): 100 mg via INTRAVENOUS
  Filled 2020-10-02 (×7): qty 2

## 2020-10-02 NOTE — Progress Notes (Signed)
eLink Physician-Brief Progress Note Patient Name: Samuel Barry DOB: [birth year] MRN: [id number]   Date of Service  10/02/2020  HPI/Events of Note  Patient with agitated delirium, he already pulled out one iv.  eICU Interventions  Soft wrist restraints ordered.        Frederik Pear 10/02/2020, 6:33 AM

## 2020-10-02 NOTE — Procedures (Signed)
Cortrak  Tube Type:  Cortrak - 43 inches Tube Location:  Right nare Initial Placement:  Stomach Secured by: Bridle Technique Used to Measure Tube Placement:  Documented cm marking at nare/ corner of mouth Cortrak Secured At:  78 cm    Cortrak Tube Team Note:  Consult received to place a Cortrak feeding tube.    X-ray is required, abdominal x-ray has been ordered by the Cortrak team. Please confirm tube placement before using the Cortrak tube.   If the tube becomes dislodged please keep the tube and contact the Cortrak team at [email or web address] (password [email or web address]) for replacement.  If after hours and replacement cannot be delayed, place a NG tube and confirm placement with an abdominal x-ray.    Koleen Distance MS, RD, LDN Please refer to [facility] for RD and/or RD on-call/weekend/after hours pager

## 2020-10-02 NOTE — Progress Notes (Signed)
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 fracture  CT Subtle, nondisplaced fracture through the right side of the C5 vertebral body  On C collar  NSG on board  No surgery indicated at this time  ? SDH  CT head ? Small SDH right parafalcine frontal lobe  MRI pending for further evaluation   No antithrombotics now  Hypertension . Stable . BP goal < 160  Long term BP goal normotensive  Hyperlipidemia  Home meds:  none   LDL 65, goal < 70  Consider statin once po access  Other Stroke Risk Factors  Advanced age  Quit smoking 98 years ago  Other Active Problems  Fever Tmax 101.2 - monitoring  Hospital day # 0  This patient is critically ill due to left PCA stroke, afib, thyroid storm, C5 fracture and at significant risk of neurological worsening, death form recurrent stroke, hemorrhagic conversion, thyroid syndrome, spinal cord compression. This patient's care requires constant monitoring of vital signs, hemodynamics, respiratory and cardiac monitoring, review of multiple databases, neurological assessment, discussion with family, other specialists and medical decision making of high complexity. I  spent 40 minutes of neurocritical care time in the care of this patient. I had long discussion with son at bedside, updated pt current condition, treatment plan and potential prognosis, and answered all the questions. He expressed understanding and appreciation. I also discussed with Dr. Tacy Learn CCM.    Samuel Hawking, MD PhD Stroke Neurology 10/02/2020 10:34 AM    To contact Stroke Continuity provider, please refer to [email or web address] After hours, contact General Neurology

## 2020-10-02 NOTE — TOC CAGE-AID Note (Signed)
Transition of Care [facility]) - CAGE-AID Screening   Patient Details  Name: Samuel Barry MRN: [id number] Date of Birth: Oct 08, 1945     Elvina Sidle, RN  Trauma Response Nurse Phone Number: [phone] 10/02/2020, 10:33 AM   Clinical Narrative:    CAGE-AID Screening: Substance Abuse Screening unable to be completed due to: : Patient unable to participate (pt has dementia and is unable to answer questions)
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
## 2020-10-02 NOTE — Progress Notes (Signed)
Pt globally aphasic, unable to take oral medications, specifically PTU.  Notified Elink RN to be relayed to Dr. Lucile Shutters, CCM to see if there is an alternative to this med.
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
## 2020-10-02 NOTE — Progress Notes (Signed)
  Echocardiogram 2D Echocardiogram has been performed with Definity.  Samuel Barry 10/02/2020, 5:39 PM

## 2020-10-03 ENCOUNTER — Inpatient Hospital Stay (HOSPITAL_COMMUNITY): Payer: Medicare HMO

## 2020-10-03 DIAGNOSIS — E0591 Thyrotoxicosis, unspecified with thyrotoxic crisis or storm: Secondary | ICD-10-CM | POA: Diagnosis not present

## 2020-10-03 DIAGNOSIS — R410 Disorientation, unspecified: Secondary | ICD-10-CM | POA: Diagnosis not present

## 2020-10-03 DIAGNOSIS — E440 Moderate protein-calorie malnutrition: Secondary | ICD-10-CM | POA: Diagnosis not present

## 2020-10-03 DIAGNOSIS — I63432 Cerebral infarction due to embolism of left posterior cerebral artery: Secondary | ICD-10-CM | POA: Diagnosis not present

## 2020-10-03 LAB — MAGNESIUM
Magnesium: 2.1 mg/dL (ref 1.7–2.4)
Magnesium: 2.2 mg/dL (ref 1.7–2.4)

## 2020-10-03 LAB — GLUCOSE, CAPILLARY
Glucose-Capillary: 159 mg/dL — ABNORMAL HIGH (ref 70–99)
Glucose-Capillary: 137 mg/dL — ABNORMAL HIGH (ref 70–99)
Glucose-Capillary: 150 mg/dL — ABNORMAL HIGH (ref 70–99)
Glucose-Capillary: 190 mg/dL — ABNORMAL HIGH (ref 70–99)
Glucose-Capillary: 217 mg/dL — ABNORMAL HIGH (ref 70–99)
Glucose-Capillary: 161 mg/dL — ABNORMAL HIGH (ref 70–99)

## 2020-10-03 LAB — T3, FREE: T3, Free: 11.5 pg/mL — ABNORMAL HIGH (ref 2.0–4.4)

## 2020-10-03 LAB — BASIC METABOLIC PANEL
Anion gap: 7 (ref 5–15)
BUN: 28 mg/dL — ABNORMAL HIGH (ref 8–23)
CO2: 23 mmol/L (ref 22–32)
Calcium: 8.8 mg/dL — ABNORMAL LOW (ref 8.9–10.3)
Chloride: 109 mmol/L (ref 98–111)
Creatinine, Ser: 1 mg/dL (ref 0.61–1.24)
GFR, Estimated: >60 mL/min (ref >60)
Glucose, Bld: 132 mg/dL — ABNORMAL HIGH (ref 70–99)
Potassium: 4.1 mmol/L (ref 3.5–5.1)
Sodium: 139 mmol/L (ref 135–145)

## 2020-10-03 LAB — RAPID URINE DRUG SCREEN, HOSP PERFORMED
Amphetamines: NOT DETECTED
Barbiturates: NOT DETECTED
Benzodiazepines: POSITIVE — AB
Cocaine: NOT DETECTED
Opiates: NOT DETECTED
Tetrahydrocannabinol: NOT DETECTED

## 2020-10-03 LAB — CBC
HCT: 40.6 % (ref 39.0–52.0)
Hemoglobin: 13.2 g/dL (ref 13.0–17.0)
MCH: 28.9 pg (ref 26.0–34.0)
MCHC: 32.5 g/dL (ref 30.0–36.0)
MCV: 89 fL (ref 80.0–100.0)
Platelets: 174 10*3/uL (ref 150–400)
RBC: 4.56 MIL/uL (ref 4.22–5.81)
RDW: 13.6 % (ref 11.5–15.5)
WBC: 9.7 10*3/uL (ref 4.0–10.5)
nRBC: 0 % (ref 0.0–0.2)

## 2020-10-03 LAB — PHOSPHORUS
Phosphorus: 2.5 mg/dL (ref 2.5–4.6)
Phosphorus: 3.8 mg/dL (ref 2.5–4.6)

## 2020-10-03 MED ORDER — PROPRANOLOL HCL 20 MG PO TABS
20.0000 mg | ORAL_TABLET | Freq: Four times a day (QID) | ORAL | Status: DC
  Administered 2020-10-03: 20 mg

## 2020-10-03 MED ORDER — PROPRANOLOL HCL 10 MG PO TABS
10.0000 mg | ORAL_TABLET | Freq: Four times a day (QID) | ORAL | Status: DC
  Administered 2020-10-04 – 2020-10-05 (×5): 10 mg
  Filled 2020-10-03 (×5): qty 1

## 2020-10-03 MED ORDER — LORAZEPAM 2 MG/ML IJ SOLN
2.0000 mg | Freq: Once | INTRAMUSCULAR | Status: AC
  Administered 2020-10-04: 2 mg via INTRAVENOUS
  Filled 2020-10-03: qty 1

## 2020-10-03 MED ORDER — HALOPERIDOL LACTATE 5 MG/ML IJ SOLN
1.0000 mg | Freq: Four times a day (QID) | INTRAMUSCULAR | Status: AC | PRN
  Administered 2020-10-03 (×2): 1 mg via INTRAVENOUS
  Filled 2020-10-03: qty 1

## 2020-10-03 MED ORDER — HALOPERIDOL LACTATE 5 MG/ML IJ SOLN
INTRAMUSCULAR | Status: AC
  Administered 2020-10-03: 5 mg
  Filled 2020-10-03: qty 1

## 2020-10-03 MED ORDER — LORAZEPAM 2 MG/ML IJ SOLN
INTRAMUSCULAR | Status: AC
  Administered 2020-10-03: 2 mg
  Filled 2020-10-03: qty 1

## 2020-10-03 MED ORDER — PROPRANOLOL HCL 20 MG PO TABS: 20.0000 mg | ORAL_TABLET | Freq: Four times a day (QID) | ORAL | Status: DC

## 2020-10-03 MED ORDER — LORAZEPAM 2 MG/ML IJ SOLN: 2.0000 mg | Freq: Once | INTRAMUSCULAR | Status: AC

## 2020-10-03 MED ORDER — QUETIAPINE FUMARATE 50 MG PO TABS
50.0000 mg | ORAL_TABLET | Freq: Every day | ORAL | Status: DC
  Administered 2020-10-03 – 2020-10-05 (×3): 50 mg
  Filled 2020-10-03: qty 2
  Filled 2020-10-03: qty 1
  Filled 2020-10-03: qty 2

## 2020-10-03 MED ORDER — DEXMEDETOMIDINE HCL IN NACL 400 MCG/100ML IV SOLN
0.1000 ug/kg/h | INTRAVENOUS | Status: DC
  Administered 2020-10-03 (×2): 0.4 ug/kg/h via INTRAVENOUS
  Administered 2020-10-03: 0.1 ug/kg/h via INTRAVENOUS
  Administered 2020-10-03: 1 ug/kg/h via INTRAVENOUS
  Administered 2020-10-04: 0.8 ug/kg/h via INTRAVENOUS
  Filled 2020-10-03 (×3): qty 100

## 2020-10-03 MED ORDER — PROPRANOLOL HCL 20 MG PO TABS
20.0000 mg | ORAL_TABLET | Freq: Four times a day (QID) | ORAL | Status: DC
  Filled 2020-10-03: qty 1

## 2020-10-03 NOTE — Progress Notes (Addendum)
eLink Physician-Brief Progress Note Patient Name: MANSUR PATTI DOB: 11/22/1945 MRN: [id number]   Date of Service  10/03/2020  HPI/Events of Note  Patient with agitated delirium. QTc 352, BP 146/77.  eICU Interventions  Haldol 1-2 mg iv Q 6 hours PRN agitated delirium ordered.        Kerry Kass [place] 10/03/2020, 5:32 AM
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
## 2020-10-03 NOTE — Progress Notes (Signed)
Pt pleasantly confused all night, but became agitated and aggressive towards staff once he arrived in MRI, even with low dose Precedex gtt.  He began making threats towards staff, refusing MRI.  Once back in pts room he became even more aggressive and was restrained.  Dr. Lucile Shutters notified, orders given.  MRI on hold for now.
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
## 2020-10-03 NOTE — Progress Notes (Addendum)
eLink Physician-Brief Progress Note Patient Name: Samuel Barry DOB: 07/27/1945 MRN: [id number]   Date of Service  10/03/2020  HPI/Events of Note  Patient with CVA, thyroid storm, atrial fibrillation with RVR, and agitated delirium who requires sedation for a trip to MRI. Patient appears drowsy at baseline and is in a C collar raising concerns about the potential risk of respiratory depression.  eICU Interventions  Will order low dose Precedex for sedation in MRI.        Daltyn Degroat U Shelly Spenser 10/03/2020, 3:30 AM

## 2020-10-04 ENCOUNTER — Inpatient Hospital Stay (HOSPITAL_COMMUNITY): Payer: Medicare HMO

## 2020-10-04 DIAGNOSIS — I63432 Cerebral infarction due to embolism of left posterior cerebral artery: Secondary | ICD-10-CM | POA: Diagnosis not present

## 2020-10-04 DIAGNOSIS — E0591 Thyrotoxicosis, unspecified with thyrotoxic crisis or storm: Secondary | ICD-10-CM | POA: Diagnosis not present

## 2020-10-04 DIAGNOSIS — R410 Disorientation, unspecified: Secondary | ICD-10-CM | POA: Diagnosis not present

## 2020-10-04 LAB — CBC
HCT: 40.6 % (ref 39.0–52.0)
Hemoglobin: 13.5 g/dL (ref 13.0–17.0)
MCH: 29.4 pg (ref 26.0–34.0)
MCHC: 33.3 g/dL (ref 30.0–36.0)
MCV: 88.5 fL (ref 80.0–100.0)
Platelets: 157 10*3/uL (ref 150–400)
RBC: 4.59 MIL/uL (ref 4.22–5.81)
RDW: 13.5 % (ref 11.5–15.5)
WBC: 11.7 10*3/uL — ABNORMAL HIGH (ref 4.0–10.5)
nRBC: 0 % (ref 0.0–0.2)

## 2020-10-04 LAB — GLUCOSE, CAPILLARY
Glucose-Capillary: 164 mg/dL — ABNORMAL HIGH (ref 70–99)
Glucose-Capillary: 151 mg/dL — ABNORMAL HIGH (ref 70–99)
Glucose-Capillary: 190 mg/dL — ABNORMAL HIGH (ref 70–99)
Glucose-Capillary: 136 mg/dL — ABNORMAL HIGH (ref 70–99)
Glucose-Capillary: 120 mg/dL — ABNORMAL HIGH (ref 70–99)
Glucose-Capillary: 119 mg/dL — ABNORMAL HIGH (ref 70–99)

## 2020-10-04 LAB — BASIC METABOLIC PANEL
Anion gap: 4 — ABNORMAL LOW (ref 5–15)
BUN: 39 mg/dL — ABNORMAL HIGH (ref 8–23)
CO2: 25 mmol/L (ref 22–32)
Calcium: 8.8 mg/dL — ABNORMAL LOW (ref 8.9–10.3)
Chloride: 109 mmol/L (ref 98–111)
Creatinine, Ser: 0.97 mg/dL (ref 0.61–1.24)
GFR, Estimated: >60 mL/min (ref >60)
Glucose, Bld: 212 mg/dL — ABNORMAL HIGH (ref 70–99)
Potassium: 4.5 mmol/L (ref 3.5–5.1)
Sodium: 138 mmol/L (ref 135–145)

## 2020-10-04 LAB — RAPID HIV SCREEN (HIV 1/2 AB+AG)
HIV 1/2 Antibodies: NONREACTIVE
HIV-1 P24 Antigen - HIV24: NONREACTIVE

## 2020-10-04 MED ORDER — LORAZEPAM 2 MG/ML IJ SOLN
2.0000 mg | Freq: Once | INTRAMUSCULAR | Status: AC
  Administered 2020-10-04: 2 mg via INTRAVENOUS
  Filled 2020-10-04: qty 1

## 2020-10-04 MED ORDER — ATORVASTATIN CALCIUM 10 MG PO TABS
20.0000 mg | ORAL_TABLET | Freq: Every day | ORAL | Status: DC
  Administered 2020-10-04 – 2020-10-13 (×10): 20 mg
  Filled 2020-10-04 (×10): qty 2
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
## 2020-10-05 DIAGNOSIS — E0591 Thyrotoxicosis, unspecified with thyrotoxic crisis or storm: Secondary | ICD-10-CM | POA: Diagnosis not present

## 2020-10-05 DIAGNOSIS — I6300 Cerebral infarction due to thrombosis of unspecified precerebral artery: Secondary | ICD-10-CM | POA: Diagnosis not present

## 2020-10-05 LAB — COMPREHENSIVE METABOLIC PANEL
ALT: 18 U/L (ref 0–44)
AST: 14 U/L — ABNORMAL LOW (ref 15–41)
Albumin: 2.7 g/dL — ABNORMAL LOW (ref 3.5–5.0)
Alkaline Phosphatase: 65 U/L (ref 38–126)
Anion gap: 6 (ref 5–15)
BUN: 39 mg/dL — ABNORMAL HIGH (ref 8–23)
CO2: 28 mmol/L (ref 22–32)
Calcium: 9.1 mg/dL (ref 8.9–10.3)
Chloride: 110 mmol/L (ref 98–111)
Creatinine, Ser: 0.9 mg/dL (ref 0.61–1.24)
GFR, Estimated: >60 mL/min (ref >60)
Glucose, Bld: 171 mg/dL — ABNORMAL HIGH (ref 70–99)
Potassium: 4.2 mmol/L (ref 3.5–5.1)
Sodium: 144 mmol/L (ref 135–145)
Total Bilirubin: 1.2 mg/dL (ref 0.3–1.2)
Total Protein: 5.7 g/dL — ABNORMAL LOW (ref 6.5–8.1)

## 2020-10-05 LAB — GLUCOSE, CAPILLARY
Glucose-Capillary: 109 mg/dL — ABNORMAL HIGH (ref 70–99)
Glucose-Capillary: 120 mg/dL — ABNORMAL HIGH (ref 70–99)
Glucose-Capillary: 125 mg/dL — ABNORMAL HIGH (ref 70–99)
Glucose-Capillary: 129 mg/dL — ABNORMAL HIGH (ref 70–99)
Glucose-Capillary: 166 mg/dL — ABNORMAL HIGH (ref 70–99)

## 2020-10-05 LAB — CBC
HCT: 42.6 % (ref 39.0–52.0)
Hemoglobin: 13.8 g/dL (ref 13.0–17.0)
MCH: 29.2 pg (ref 26.0–34.0)
MCHC: 32.4 g/dL (ref 30.0–36.0)
MCV: 90.3 fL (ref 80.0–100.0)
Platelets: 166 10*3/uL (ref 150–400)
RBC: 4.72 MIL/uL (ref 4.22–5.81)
RDW: 13.6 % (ref 11.5–15.5)
WBC: 10.4 10*3/uL (ref 4.0–10.5)
nRBC: 0 % (ref 0.0–0.2)

## 2020-10-05 LAB — TSH: TSH: <0.01 u[IU]/mL — ABNORMAL LOW (ref 0.350–4.500)

## 2020-10-05 MED ORDER — NAPHAZOLINE-GLYCERIN 0.012-0.25 % OP SOLN
2.0000 [drp] | Freq: Two times a day (BID) | OPHTHALMIC | Status: DC
  Administered 2020-10-05 – 2020-10-20 (×31): 2 [drp] via OPHTHALMIC
  Filled 2020-10-05 (×2): qty 15

## 2020-10-05 MED ORDER — LORAZEPAM 2 MG/ML IJ SOLN
1.0000 mg | Freq: Four times a day (QID) | INTRAMUSCULAR | Status: DC | PRN
  Administered 2020-10-05 – 2020-10-15 (×7): 1 mg via INTRAVENOUS
  Filled 2020-10-05 (×7): qty 1

## 2020-10-05 MED ORDER — PROPYLTHIOURACIL 50 MG PO TABS
100.0000 mg | ORAL_TABLET | ORAL | Status: DC
  Administered 2020-10-05 – 2020-10-08 (×16): 100 mg
  Filled 2020-10-05 (×19): qty 2

## 2020-10-05 MED ORDER — FREE WATER
200.0000 mL | Freq: Four times a day (QID) | Status: DC
  Administered 2020-10-05 – 2020-10-07 (×8): 200 mL

## 2020-10-05 MED ORDER — PROPRANOLOL HCL 10 MG PO TABS
10.0000 mg | ORAL_TABLET | Freq: Two times a day (BID) | ORAL | Status: DC
  Administered 2020-10-05 – 2020-10-07 (×5): 10 mg
  Filled 2020-10-05 (×5): qty 1

## 2020-10-05 MED ORDER — HEPARIN (PORCINE) 25000 UT/250ML-% IV SOLN
1300.0000 [IU]/h | INTRAVENOUS | Status: DC
  Administered 2020-10-05: 900 [IU]/h via INTRAVENOUS
  Administered 2020-10-06 – 2020-10-07 (×2): 1250 [IU]/h via INTRAVENOUS
  Filled 2020-10-05 (×5): qty 250

## 2020-10-05 NOTE — Progress Notes (Signed)
Inpatient Rehab Admissions Coordinator Note:   Per PT recommendation, pt was screened for CIR candidacy by Gayland Curry, MS, CCC-SLP.  At this time we are recommending an inpatient rehab consult. Please place and IP Rehab MD consult if pt would like to be considered.  Please contact me with questions.    Gayland Curry, [place], [place] Admissions Coordinator [phone] 10/05/20 4:35 PM

## 2020-10-05 NOTE — Progress Notes (Signed)
ANTICOAGULATION CONSULT NOTE - Initial Consult  Pharmacy Consult for heparin  Indication: CVA and afib  Allergies  Allergen Reactions  . Doxycycline Monohydrate Itching    *was taking two antibiotic at same time   . Sulfamethoxazole-Trimethoprim Itching    *was taking antibiotics at same time.  Marland Kitchen Penicillin G Rash    Patient Measurements: Height: 5' 8.25" (173.4 cm) Weight: 72.8 kg (160 lb 7.9 oz) IBW/kg (Calculated) : 68.98   Vital Signs: Temp: 97.7 F (36.5 C) (05/21 1500) Temp Source: Axillary (05/21 1500) BP: 158/89 (05/21 1500) Pulse Rate: 94 (05/21 1521)  Labs: Recent Labs    10/03/20 0811 10/04/20 0423 10/05/20 0206  HGB 13.2 13.5 13.8  HCT 40.6 40.6 42.6  PLT 174 157 166  CREATININE 1.00 0.97 0.90    Estimated Creatinine Clearance: 69.2 mL/min (by C-G formula based on SCr of 0.9 mg/dL).   Medical History: Past Medical History:  Diagnosis Date  . Allergic rhinitis    per [facility] notes  . Allergy   . 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 Chronic pancreatitis ([place])   . Constipation    per [facility] notes  . Dementia ([place])   . Depression   . History of colon polyps    per [facility] notes  . Hypertension   . Hyperthyroidism    per [facility] notes  . Kidney stones   . Memory loss   . Prostate cancer ([place]) 2005  . Vitamin D deficiency    per [facility] notes    Medications:  Medications Prior to Admission  Medication Sig Dispense Refill Last Dose  . cyanocobalamin 1000 MCG tablet Take 1,000 mcg by mouth daily.   Past Week at Unknown time  . donepezil (ARICEPT) 10 MG tablet Take 1 tablet (10 mg total) by mouth at bedtime. 90 tablet 3 Past Week at Unknown time  . hyoscyamine (ANASPAZ) 0.125 MG TBDP disintergrating tablet Place 0.125 mg under the tongue every 4 (four) hours as needed for cramping.   Past Week at Unknown time  . methimazole (TAPAZOLE) 5 MG tablet Take by mouth. 10 mg in AM  and 5 mg in PM   Past Week at Unknown time  . Multiple Vitamin (MULTIVITAMIN) capsule Take 1 capsule by mouth daily.   Past Week at Unknown time  . pantoprazole (PROTONIX) 40 MG tablet Take 40 mg by mouth daily.   Past Week at Unknown time  . VITAMIN D PO Take 50,000 Units by mouth every Sunday.   Past Week at Unknown time   Scheduled:  . atorvastatin  20 mg Per Tube Daily  . Chlorhexidine Gluconate Cloth  6 each Topical Daily  . feeding supplement (PROSource TF)  45 mL Per Tube TID  . free water  200 mL Per Tube Q6H  . naphazoline-glycerin  2 drop Both Eyes BID  . propranolol  10 mg Per Tube BID  . propylthiouracil  100 mg Per Tube Q4H  . QUEtiapine  50 mg Per Tube QHS    Assessment: 75 yo male with CVA likely due to afib. Pharmacy consults to dose heparin and plans are noted for apixaban later -hg= 13.8, pl=166   Goal of Therapy:  Heparin level 0.3-0.5 units/ml Monitor platelets by anticoagulation protocol: Yes   Plan:  -No heparin bolus -Begin heparin at 900 units/hr -Heparin level in 8 hours and daily wth CBC daily  Hildred Laser, PharmD Clinical  Pharmacist **Pharmacist phone directory can now be found on [email or web address] ([email or web address]).  Listed under [place].
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
## 2020-10-05 NOTE — Evaluation (Signed)
Clinical/Bedside Swallow Evaluation Patient Details  Name: Samuel Barry MRN: [id number] Date of Birth: 09/09/1945  Today's Date: 10/05/2020 Time: SLP Start Time (ACUTE ONLY): 1120 SLP Stop Time (ACUTE ONLY): 1140 SLP Time Calculation (min) (ACUTE ONLY): 20 min  Past Medical History:  Past Medical History:  Diagnosis Date  . Allergic rhinitis    per [facility] notes  . Allergy   . 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 Chronic pancreatitis ([place])   . Constipation    per [facility] notes  . Dementia ([place])   . Depression   . History of colon polyps    per [facility] notes  . Hypertension   . Hyperthyroidism    per [facility] notes  . Kidney stones   . Memory loss   . Prostate cancer ([place]) 2005  . Vitamin D deficiency    per [facility] notes   Past Surgical History:  Past Surgical History:  Procedure Laterality Date  . kidney stones  1990  . penile pump    . surgery for prostate cancer  2005   HPI:      Assessment / Plan / Recommendation Clinical Impression  Patient presents with a moderate oropharyngeal dysphagia which appears to be impacted signifcantly by his current cognitive state of confusion and agitation. SLP performed oral care (patient not fully compliant) and removed small amount of thick Spreen-yellow secretions from hard and soft palate. Patient exhibited decreased awareness and decreased oral control and transit of small ice chips and small cup sips of water. No overt coughing observed with ice chips but patient exhibited immediate and delayed throat clearing after sips of thin liquids (water) followed by congested glottal sounding voice. At this time in evaluation, patient was becoming increasingly agitated, demanding cervical collar be removed and reporting that it was choking him. He then would not allow for SLP to perform more oral care and instead was verbally and physically agitated to the point that SLP had to  get his RN. Patient not ready for PO's at this time, however SLP recommending allow small amount of ice chips or water wips PRN when alert and after oral care. SLP Visit Diagnosis: Dysphagia, unspecified (R13.10)    Aspiration Risk  Moderate aspiration risk;Severe aspiration risk;Risk for inadequate nutrition/hydration    Diet Recommendation NPO;Other (Comment) (small amount (2-3) ice chips or 1-2 small cup sips of water if alert and after oral care)   Liquid Administration via: Cup;No straw Medication Administration: Via alternative means Supervision: Staff to assist with self feeding;Full supervision/cueing for compensatory strategies Postural Changes: Seated upright at 90 degrees    Other  Recommendations Oral Care Recommendations: Oral care QID;Staff/trained caregiver to provide oral care   Follow up Recommendations 24 hour supervision/assistance;Skilled Nursing facility      Frequency and Duration min 2x/week  1 week       Prognosis Prognosis for Safe Diet Advancement: Good Barriers to Reach Goals: Cognitive deficits      Swallow Study   General      Oral/Motor/Sensory Function Overall Oral Motor/Sensory Function: Other (comment) (difficult to fully assess secondary to patient not fully compliant but appears with intact oral motor function) Facial ROM: Within Functional Limits Facial Symmetry: Within Functional Limits Lingual Symmetry: Within Functional Limits   Ice Chips Ice chips: Impaired Presentation: Spoon Oral Phase Impairments: Reduced lingual movement/coordination;Poor awareness of bolus Oral Phase Functional Implications: Prolonged oral transit;Oral holding Pharyngeal Phase  Impairments: Suspected delayed Swallow   Thin Liquid Thin Liquid: Impaired Presentation: Cup Oral Phase Impairments: Reduced labial seal;Poor awareness of bolus Oral Phase Functional Implications: Prolonged oral transit Pharyngeal  Phase Impairments: Suspected delayed Swallow;Throat  Clearing - Immediate    Nectar Thick   NT  Honey Thick   NT  Puree   NT  Solid       NT     Sonia Baller, MA, CCC-SLP Speech Therapy [facility]
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
## 2020-10-05 NOTE — Progress Notes (Signed)
eLink Physician-Brief Progress Note Patient Name: Samuel Barry DOB: 1945-11-19 MRN: [id number]   Date of Service  10/05/2020  HPI/Events of Note  Urinary retention - Patient has been I/O Cathed X 3 and still having high residuals.   eICU Interventions  Place Foley Catheter.      Intervention Category Major Interventions: Other:  Ronita Hargreaves Cornelia Copa 10/05/2020, 4:07 AM
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
## 2020-10-06 DIAGNOSIS — E0591 Thyrotoxicosis, unspecified with thyrotoxic crisis or storm: Secondary | ICD-10-CM | POA: Diagnosis not present

## 2020-10-06 DIAGNOSIS — E440 Moderate protein-calorie malnutrition: Secondary | ICD-10-CM | POA: Diagnosis not present

## 2020-10-06 DIAGNOSIS — I6300 Cerebral infarction due to thrombosis of unspecified precerebral artery: Secondary | ICD-10-CM | POA: Diagnosis not present

## 2020-10-06 LAB — HEPATIC FUNCTION PANEL
ALT: 21 U/L (ref 0–44)
AST: 16 U/L (ref 15–41)
Albumin: 2.9 g/dL — ABNORMAL LOW (ref 3.5–5.0)
Alkaline Phosphatase: 78 U/L (ref 38–126)
Bilirubin, Direct: 0.2 mg/dL (ref 0.0–0.2)
Indirect Bilirubin: 1.4 mg/dL — ABNORMAL HIGH (ref 0.3–0.9)
Total Bilirubin: 1.6 mg/dL — ABNORMAL HIGH (ref 0.3–1.2)
Total Protein: 6.2 g/dL — ABNORMAL LOW (ref 6.5–8.1)

## 2020-10-06 LAB — GLUCOSE, CAPILLARY
Glucose-Capillary: 123 mg/dL — ABNORMAL HIGH (ref 70–99)
Glucose-Capillary: 106 mg/dL — ABNORMAL HIGH (ref 70–99)
Glucose-Capillary: 134 mg/dL — ABNORMAL HIGH (ref 70–99)

## 2020-10-06 LAB — BASIC METABOLIC PANEL
Anion gap: 5 (ref 5–15)
BUN: 32 mg/dL — ABNORMAL HIGH (ref 8–23)
CO2: 32 mmol/L (ref 22–32)
Calcium: 8.7 mg/dL — ABNORMAL LOW (ref 8.9–10.3)
Chloride: 105 mmol/L (ref 98–111)
Creatinine, Ser: 0.89 mg/dL (ref 0.61–1.24)
GFR, Estimated: >60 mL/min (ref >60)
Glucose, Bld: 113 mg/dL — ABNORMAL HIGH (ref 70–99)
Potassium: 3.8 mmol/L (ref 3.5–5.1)
Sodium: 142 mmol/L (ref 135–145)

## 2020-10-06 LAB — HEPARIN LEVEL (UNFRACTIONATED)
Heparin Unfractionated: <0.1 IU/mL — ABNORMAL LOW (ref 0.30–0.70)
Heparin Unfractionated: 0.17 IU/mL — ABNORMAL LOW (ref 0.30–0.70)
Heparin Unfractionated: 0.36 IU/mL (ref 0.30–0.70)

## 2020-10-06 LAB — CBC
HCT: 43.1 % (ref 39.0–52.0)
Hemoglobin: 14.1 g/dL (ref 13.0–17.0)
MCH: 29.4 pg (ref 26.0–34.0)
MCHC: 32.7 g/dL (ref 30.0–36.0)
MCV: 90 fL (ref 80.0–100.0)
Platelets: 175 10*3/uL (ref 150–400)
RBC: 4.79 MIL/uL (ref 4.22–5.81)
RDW: 13.5 % (ref 11.5–15.5)
WBC: 8.2 10*3/uL (ref 4.0–10.5)
nRBC: 0 % (ref 0.0–0.2)

## 2020-10-06 LAB — PHOSPHORUS: Phosphorus: 4 mg/dL (ref 2.5–4.6)

## 2020-10-06 LAB — MAGNESIUM: Magnesium: 2 mg/dL (ref 1.7–2.4)

## 2020-10-06 MED ORDER — QUETIAPINE FUMARATE 25 MG PO TABS
75.0000 mg | ORAL_TABLET | Freq: Every day | ORAL | Status: DC
  Administered 2020-10-06 – 2020-10-14 (×8): 75 mg
  Filled 2020-10-06: qty 2
  Filled 2020-10-06 (×3): qty 3
  Filled 2020-10-06: qty 2
  Filled 2020-10-06 (×3): qty 3

## 2020-10-06 MED ORDER — DONEPEZIL HCL 10 MG PO TABS
10.0000 mg | ORAL_TABLET | Freq: Every day | ORAL | Status: DC
  Administered 2020-10-06 – 2020-10-19 (×13): 10 mg
  Filled 2020-10-06 (×13): qty 1
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
## 2020-10-06 NOTE — Progress Notes (Signed)
[place] for heparin  Indication: CVA and afib  Allergies  Allergen Reactions  . Doxycycline Monohydrate Itching    *was taking two antibiotic at same time   . Sulfamethoxazole-Trimethoprim Itching    *was taking antibiotics at same time.  Marland Kitchen Penicillin G Rash    Patient Measurements: Height: 5' 8.25" (173.4 cm) Weight: 74.4 kg (164 lb 0.4 oz) IBW/kg (Calculated) : 68.98  Heparin Dosing Weight: 74 kg   Vital Signs: Temp: 99.4 F (37.4 C) (05/22 0703) Temp Source: Axillary (05/22 0703) BP: 154/83 (05/22 0703) Pulse Rate: 86 (05/22 0703)  Labs: Recent Labs    10/04/20 0423 10/05/20 0206 10/06/20 0201  HGB 13.5 13.8 14.1  HCT 40.6 42.6 43.1  PLT 157 166 175  HEPARINUNFRC  --   --  <0.10*  CREATININE 0.97 0.90 0.89    Estimated Creatinine Clearance: 70 mL/min (by C-G formula based on SCr of 0.89 mg/dL).    Assessment: 75 yo male with CVA likely due to afib. Pharmacy consults to dose heparin and plans are noted for apixaban later  Heparin level this morning remains SUBtherapeutic despite increase earlier today (HL 0.17, goal of 0.3-0.5). CBC wnl - no bleeding noted.   Goal of Therapy:  Heparin level 0.3-0.5 units/ml Monitor platelets by anticoagulation protocol: Yes   Plan:  - Increase Heparin to 1250 units/hr (12.5 ml/hr) - Will continue to monitor for any signs/symptoms of bleeding and will follow up with heparin level in 8 hours   Thank you for allowing pharmacy to be a part of this patient's care.  Alycia Rossetti, PharmD, BCPS Clinical Pharmacist Clinical phone for 10/06/2020: [phone] 10/06/2020 12:08 PM   **Pharmacist phone directory can now be found on [email or web address] ([email or web address]).  Listed under [place].

## 2020-10-06 NOTE — Progress Notes (Signed)
[place] for heparin  Indication: CVA and afib  Allergies  Allergen Reactions  . Doxycycline Monohydrate Itching    *was taking two antibiotic at same time   . Sulfamethoxazole-Trimethoprim Itching    *was taking antibiotics at same time.  Marland Kitchen Penicillin G Rash    Patient Measurements: Height: 5' 8.25" (173.4 cm) Weight: 72.8 kg (160 lb 7.9 oz) IBW/kg (Calculated) : 68.98   Vital Signs: Temp: 98 F (36.7 C) (05/21 2341) Temp Source: Oral (05/21 2341) BP: 157/97 (05/21 2341) Pulse Rate: 98 (05/22 0141)  Labs: Recent Labs    10/03/20 0811 10/04/20 0423 10/05/20 0206 10/06/20 0201  HGB 13.2 13.5 13.8 14.1  HCT 40.6 40.6 42.6 43.1  PLT 174 157 166 175  HEPARINUNFRC  --   --   --  <0.10*  CREATININE 1.00 0.97 0.90  --     Estimated Creatinine Clearance: 69.2 mL/min (by C-G formula based on SCr of 0.9 mg/dL).    Assessment: 75 yo male with CVA likely due to afib. Pharmacy consults to dose heparin and plans are noted for apixaban later  Heparin level undetectable on gtt at 900 units/hr. No issues with line or bleeding reported per RN.  Goal of Therapy:  Heparin level 0.3-0.5 units/ml Monitor platelets by anticoagulation protocol: Yes   Plan:  Increase heparin to 1100 units/hr Heparin level in 8 hours  Sherlon Handing, PharmD, BCPS Please see amion for complete clinical pharmacist phone list 10/06/2020 2:51 AM
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
## 2020-10-06 NOTE — Progress Notes (Signed)
[place] for heparin  Indication: CVA and afib  Allergies  Allergen Reactions  . Doxycycline Monohydrate Itching    *was taking two antibiotic at same time   . Sulfamethoxazole-Trimethoprim Itching    *was taking antibiotics at same time.  Marland Kitchen Penicillin G Rash    Patient Measurements: Height: 5' 8.25" (173.4 cm) Weight: 74.4 kg (164 lb 0.4 oz) IBW/kg (Calculated) : 68.98  Heparin Dosing Weight: 74 kg   Vital Signs: Temp: 98.8 F (37.1 C) (05/22 2100) Temp Source: Axillary (05/22 2100) BP: 144/91 (05/22 2102) Pulse Rate: 82 (05/22 2102)  Labs: Recent Labs    10/04/20 0423 10/05/20 0206 10/06/20 0201 10/06/20 1050 10/06/20 2042  HGB 13.5 13.8 14.1  --   --   HCT 40.6 42.6 43.1  --   --   PLT 157 166 175  --   --   HEPARINUNFRC  --   --  <0.10* 0.17* 0.36  CREATININE 0.97 0.90 0.89  --   --     Estimated Creatinine Clearance: 70 mL/min (by C-G formula based on SCr of 0.89 mg/dL).    Assessment: 75 yo male with CVA likely due to afib. Pharmacy consults to dose heparin and plans are noted for apixaban later  Heparin level is now at goal on 1250 units/hr   Goal of Therapy:  Heparin level 0.3-0.5 units/ml Monitor platelets by anticoagulation protocol: Yes   Plan:  -Continue heparin at 1250 units/hr -Daily heparin level and CBC  Hildred Laser, PharmD Clinical Pharmacist **Pharmacist phone directory can now be found on [email or web address] ([email or web address]).  Listed under [place].

## 2020-10-06 NOTE — Progress Notes (Signed)
SLP Cancellation Note  Patient Details Name: Samuel Barry MRN: [id number] DOB: 05-22-1945   Cancelled treatment:       Reason Eval/Treat Not Completed: Patient's level of consciousness. Spoke with RN who stated that patient has not been awake much today as he did not sleep last night. SLP to f/u with patient next date to determine readiness for PO's.   Sonia Baller, MA, CCC-SLP Speech Therapy [facility] Acute Rehab

## 2020-10-07 DIAGNOSIS — E440 Moderate protein-calorie malnutrition: Secondary | ICD-10-CM | POA: Diagnosis not present

## 2020-10-07 DIAGNOSIS — E0591 Thyrotoxicosis, unspecified with thyrotoxic crisis or storm: Secondary | ICD-10-CM | POA: Diagnosis not present

## 2020-10-07 DIAGNOSIS — S069X0A Unspecified intracranial injury without loss of consciousness, initial encounter: Secondary | ICD-10-CM | POA: Diagnosis not present

## 2020-10-07 DIAGNOSIS — I63032 Cerebral infarction due to thrombosis of left carotid artery: Secondary | ICD-10-CM

## 2020-10-07 DIAGNOSIS — I6300 Cerebral infarction due to thrombosis of unspecified precerebral artery: Secondary | ICD-10-CM | POA: Diagnosis not present

## 2020-10-07 LAB — HEPATIC FUNCTION PANEL
ALT: 20 U/L (ref 0–44)
AST: 12 U/L — ABNORMAL LOW (ref 15–41)
Albumin: 2.9 g/dL — ABNORMAL LOW (ref 3.5–5.0)
Alkaline Phosphatase: 84 U/L (ref 38–126)
Bilirubin, Direct: 0.2 mg/dL (ref 0.0–0.2)
Indirect Bilirubin: 1.2 mg/dL — ABNORMAL HIGH (ref 0.3–0.9)
Total Bilirubin: 1.4 mg/dL — ABNORMAL HIGH (ref 0.3–1.2)
Total Protein: 6.5 g/dL (ref 6.5–8.1)

## 2020-10-07 LAB — GLUCOSE, CAPILLARY
Glucose-Capillary: 130 mg/dL — ABNORMAL HIGH (ref 70–99)
Glucose-Capillary: 139 mg/dL — ABNORMAL HIGH (ref 70–99)
Glucose-Capillary: 143 mg/dL — ABNORMAL HIGH (ref 70–99)
Glucose-Capillary: 146 mg/dL — ABNORMAL HIGH (ref 70–99)
Glucose-Capillary: 154 mg/dL — ABNORMAL HIGH (ref 70–99)

## 2020-10-07 LAB — CBC
HCT: 45.3 % (ref 39.0–52.0)
Hemoglobin: 14.9 g/dL (ref 13.0–17.0)
MCH: 29 pg (ref 26.0–34.0)
MCHC: 32.9 g/dL (ref 30.0–36.0)
MCV: 88.3 fL (ref 80.0–100.0)
Platelets: 202 10*3/uL (ref 150–400)
RBC: 5.13 MIL/uL (ref 4.22–5.81)
RDW: 13.2 % (ref 11.5–15.5)
WBC: 8.8 10*3/uL (ref 4.0–10.5)
nRBC: 0 % (ref 0.0–0.2)

## 2020-10-07 LAB — MAGNESIUM: Magnesium: 2.1 mg/dL (ref 1.7–2.4)

## 2020-10-07 LAB — BASIC METABOLIC PANEL
Anion gap: 4 — ABNORMAL LOW (ref 5–15)
BUN: 25 mg/dL — ABNORMAL HIGH (ref 8–23)
CO2: 30 mmol/L (ref 22–32)
Calcium: 9 mg/dL (ref 8.9–10.3)
Chloride: 104 mmol/L (ref 98–111)
Creatinine, Ser: 0.9 mg/dL (ref 0.61–1.24)
GFR, Estimated: >60 mL/min (ref >60)
Glucose, Bld: 156 mg/dL — ABNORMAL HIGH (ref 70–99)
Potassium: 4.7 mmol/L (ref 3.5–5.1)
Sodium: 138 mmol/L (ref 135–145)

## 2020-10-07 LAB — HEPARIN LEVEL (UNFRACTIONATED): Heparin Unfractionated: 0.32 IU/mL (ref 0.30–0.70)

## 2020-10-07 LAB — AMMONIA: Ammonia: 23 umol/L (ref 9–35)

## 2020-10-07 LAB — RPR: RPR Ser Ql: NONREACTIVE

## 2020-10-07 LAB — VITAMIN B12: Vitamin B-12: 550 pg/mL (ref 180–914)

## 2020-10-07 LAB — FOLATE: Folate: 17 ng/mL (ref >5.9)

## 2020-10-07 MED ORDER — FREE WATER
100.0000 mL | Freq: Four times a day (QID) | Status: DC
  Administered 2020-10-07 – 2020-10-08 (×5): 100 mL
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
## 2020-10-07 NOTE — Progress Notes (Addendum)
Inpatient Rehab Admissions Coordinator:   I spoke with Pt.'s daughter who expressed interest in [place]. I noted decreased alertness today, but good participation during previous therapy session on 10/05/20. I will hold on opening a case with Pt.'s insurance until therapies have seen him again.   Clemens Catholic, [place], [place] Admissions Coordinator  [phone] ([place]) [phone] (office)
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
## 2020-10-07 NOTE — Progress Notes (Signed)
[place] for heparin  Indication: CVA and afib  Allergies  Allergen Reactions  . Doxycycline Monohydrate Itching    *was taking two antibiotic at same time   . Sulfamethoxazole-Trimethoprim Itching    *was taking antibiotics at same time.  Marland Kitchen Penicillin G Rash    Patient Measurements: Height: 5' 8.25" (173.4 cm) Weight: 74.4 kg (164 lb 0.4 oz) IBW/kg (Calculated) : 68.98  Heparin Dosing Weight: 74 kg   Vital Signs: Temp: 99 F (37.2 C) (05/23 0802) Temp Source: Axillary (05/23 0802) BP: 137/89 (05/23 0802) Pulse Rate: 87 (05/23 0802)  Labs: Recent Labs    10/05/20 0206 10/05/20 0206 10/06/20 0201 10/06/20 1050 10/06/20 2042 10/07/20 0422  HGB 13.8  --  14.1  --   --  14.9  HCT 42.6  --  43.1  --   --  45.3  PLT 166  --  175  --   --  202  HEPARINUNFRC  --    < > <0.10* 0.17* 0.36 0.32  CREATININE 0.90  --  0.89  --   --  0.90   < > = values in this interval not displayed.    Estimated Creatinine Clearance: 69.2 mL/min (by C-G formula based on SCr of 0.9 mg/dL).    Assessment: 75 yo male with CVA likely due to afib. Pharmacy consults to dose heparin and plans are noted for apixaban later  Heparin level is at goal on 1250 units/hr. Hgb and plts stable   Goal of Therapy:  Heparin level 0.3-0.5 units/ml Monitor platelets by anticoagulation protocol: Yes   Plan:  -Continue heparin at 1250 units/hr -Daily heparin level and CBC  Alanda Slim, PharmD, [facility] Clinical Pharmacist Please see AMION for all Pharmacists' Contact Phone Numbers 10/07/2020, 10:20 AM

## 2020-10-08 ENCOUNTER — Inpatient Hospital Stay (HOSPITAL_COMMUNITY): Payer: Medicare HMO

## 2020-10-08 DIAGNOSIS — I6300 Cerebral infarction due to thrombosis of unspecified precerebral artery: Secondary | ICD-10-CM | POA: Diagnosis not present

## 2020-10-08 DIAGNOSIS — E0591 Thyrotoxicosis, unspecified with thyrotoxic crisis or storm: Secondary | ICD-10-CM | POA: Diagnosis not present

## 2020-10-08 DIAGNOSIS — R069 Unspecified abnormalities of breathing: Secondary | ICD-10-CM | POA: Diagnosis not present

## 2020-10-08 DIAGNOSIS — E440 Moderate protein-calorie malnutrition: Secondary | ICD-10-CM | POA: Diagnosis not present

## 2020-10-08 LAB — POCT I-STAT 7, (LYTES, BLD GAS, ICA,H+H)
Acid-base deficit: 2 mmol/L (ref 0.0–2.0)
Bicarbonate: 21.9 mmol/L (ref 20.0–28.0)
Calcium, Ion: 1.14 mmol/L — ABNORMAL LOW (ref 1.15–1.40)
HCT: 31 % — ABNORMAL LOW (ref 39.0–52.0)
Hemoglobin: 10.5 g/dL — ABNORMAL LOW (ref 13.0–17.0)
O2 Saturation: 100 %
Potassium: 5.5 mmol/L — ABNORMAL HIGH (ref 3.5–5.1)
Sodium: 139 mmol/L (ref 135–145)
TCO2: 23 mmol/L (ref 22–32)
pCO2 arterial: 32.1 mmHg (ref 32.0–48.0)
pH, Arterial: 7.442 (ref 7.350–7.450)
pO2, Arterial: 261 mmHg — ABNORMAL HIGH (ref 83.0–108.0)

## 2020-10-08 LAB — TSH: TSH: <0.01 u[IU]/mL — ABNORMAL LOW (ref 0.350–4.500)

## 2020-10-08 LAB — RENAL FUNCTION PANEL
Albumin: 2.8 g/dL — ABNORMAL LOW (ref 3.5–5.0)
Anion gap: 6 (ref 5–15)
BUN: 31 mg/dL — ABNORMAL HIGH (ref 8–23)
CO2: 26 mmol/L (ref 22–32)
Calcium: 8.8 mg/dL — ABNORMAL LOW (ref 8.9–10.3)
Chloride: 106 mmol/L (ref 98–111)
Creatinine, Ser: 1.36 mg/dL — ABNORMAL HIGH (ref 0.61–1.24)
GFR, Estimated: 54 mL/min — ABNORMAL LOW (ref >60)
Glucose, Bld: 183 mg/dL — ABNORMAL HIGH (ref 70–99)
Phosphorus: 2.9 mg/dL (ref 2.5–4.6)
Potassium: 5.2 mmol/L — ABNORMAL HIGH (ref 3.5–5.1)
Sodium: 138 mmol/L (ref 135–145)

## 2020-10-08 LAB — URINALYSIS, COMPLETE (UACMP) WITH MICROSCOPIC
Bilirubin Urine: NEGATIVE
Glucose, UA: 50 mg/dL — AB
Ketones, ur: NEGATIVE mg/dL
Nitrite: NEGATIVE
Protein, ur: 100 mg/dL — AB
RBC / HPF: >50 RBC/hpf — ABNORMAL HIGH (ref 0–5)
Specific Gravity, Urine: 1.02 (ref 1.005–1.030)
pH: 7 (ref 5.0–8.0)

## 2020-10-08 LAB — CBC
HCT: 46.1 % (ref 39.0–52.0)
HCT: 36.3 % — ABNORMAL LOW (ref 39.0–52.0)
Hemoglobin: 15.2 g/dL (ref 13.0–17.0)
Hemoglobin: 11.5 g/dL — ABNORMAL LOW (ref 13.0–17.0)
MCH: 29.1 pg (ref 26.0–34.0)
MCH: 29 pg (ref 26.0–34.0)
MCHC: 33 g/dL (ref 30.0–36.0)
MCHC: 31.7 g/dL (ref 30.0–36.0)
MCV: 88.1 fL (ref 80.0–100.0)
MCV: 91.7 fL (ref 80.0–100.0)
Platelets: 211 10*3/uL (ref 150–400)
Platelets: 198 10*3/uL (ref 150–400)
RBC: 5.23 MIL/uL (ref 4.22–5.81)
RBC: 3.96 MIL/uL — ABNORMAL LOW (ref 4.22–5.81)
RDW: 13.3 % (ref 11.5–15.5)
RDW: 13.2 % (ref 11.5–15.5)
WBC: 13.4 10*3/uL — ABNORMAL HIGH (ref 4.0–10.5)
WBC: 22.6 10*3/uL — ABNORMAL HIGH (ref 4.0–10.5)
nRBC: 0 % (ref 0.0–0.2)
nRBC: 0 % (ref 0.0–0.2)

## 2020-10-08 LAB — GLUCOSE, CAPILLARY
Glucose-Capillary: 180 mg/dL — ABNORMAL HIGH (ref 70–99)
Glucose-Capillary: 216 mg/dL — ABNORMAL HIGH (ref 70–99)
Glucose-Capillary: 238 mg/dL — ABNORMAL HIGH (ref 70–99)
Glucose-Capillary: 232 mg/dL — ABNORMAL HIGH (ref 70–99)
Glucose-Capillary: 131 mg/dL — ABNORMAL HIGH (ref 70–99)
Glucose-Capillary: 137 mg/dL — ABNORMAL HIGH (ref 70–99)

## 2020-10-08 LAB — MAGNESIUM: Magnesium: 2.1 mg/dL (ref 1.7–2.4)

## 2020-10-08 LAB — HEPARIN LEVEL (UNFRACTIONATED): Heparin Unfractionated: 0.3 IU/mL (ref 0.30–0.70)

## 2020-10-08 MED ORDER — ETOMIDATE 2 MG/ML IV SOLN
30.0000 mg | Freq: Once | INTRAVENOUS | Status: AC
Start: 2020-10-08 — End: 2020-10-08
  Administered 2020-10-08: 30 mg via INTRAVENOUS

## 2020-10-08 MED ORDER — OXYCODONE HCL 5 MG/5ML PO SOLN
5.0000 mg | ORAL | Status: DC | PRN
  Administered 2020-10-08 – 2020-10-15 (×4): 5 mg
  Filled 2020-10-08 (×4): qty 5

## 2020-10-08 MED ORDER — SODIUM CHLORIDE 0.9 % IV SOLN: INTRAVENOUS | Status: DC

## 2020-10-08 MED ORDER — DOCUSATE SODIUM 50 MG/5ML PO LIQD
100.0000 mg | Freq: Two times a day (BID) | ORAL | Status: DC
  Administered 2020-10-10 – 2020-10-19 (×12): 100 mg
  Filled 2020-10-08 (×14): qty 10

## 2020-10-08 MED ORDER — FENTANYL CITRATE (PF) 100 MCG/2ML IJ SOLN
INTRAMUSCULAR | Status: AC
  Administered 2020-10-08: 100 ug via INTRAVENOUS
  Filled 2020-10-08: qty 2

## 2020-10-08 MED ORDER — METHIMAZOLE 10 MG PO TABS
20.0000 mg | ORAL_TABLET | Freq: Four times a day (QID) | ORAL | Status: DC
  Administered 2020-10-08 – 2020-10-20 (×46): 20 mg
  Filled 2020-10-08 (×52): qty 2

## 2020-10-08 MED ORDER — VANCOMYCIN HCL 1500 MG/300ML IV SOLN
1500.0000 mg | Freq: Once | INTRAVENOUS | Status: AC
  Administered 2020-10-08: 1500 mg via INTRAVENOUS
  Filled 2020-10-08 (×2): qty 300

## 2020-10-08 MED ORDER — MIDAZOLAM HCL 2 MG/2ML IJ SOLN: 2.0000 mg | Freq: Once | INTRAMUSCULAR | Status: AC

## 2020-10-08 MED ORDER — POTASSIUM IODIDE (EXPECTORANT) 1 GM/ML PO SOLN
50.0000 mg | Freq: Four times a day (QID) | ORAL | Status: DC
  Administered 2020-10-08: 50 mg
  Filled 2020-10-08 (×4): qty 0.05

## 2020-10-08 MED ORDER — SODIUM CHLORIDE 0.9 % IV SOLN: 250.0000 mL | INTRAVENOUS | Status: DC

## 2020-10-08 MED ORDER — MIDAZOLAM HCL 2 MG/2ML IJ SOLN
INTRAMUSCULAR | Status: AC
  Administered 2020-10-08: 2 mg via INTRAVENOUS
  Filled 2020-10-08: qty 2

## 2020-10-08 MED ORDER — SODIUM ZIRCONIUM CYCLOSILICATE 10 G PO PACK
10.0000 g | PACK | Freq: Once | ORAL | Status: DC
  Filled 2020-10-08: qty 1

## 2020-10-08 MED ORDER — FENTANYL CITRATE (PF) 100 MCG/2ML IJ SOLN
INTRAMUSCULAR | Status: AC
  Administered 2020-10-08: 100 ug
  Filled 2020-10-08: qty 2

## 2020-10-08 MED ORDER — TRAMADOL 5 MG/ML ORAL SUSPENSION: 50.0000 mg | Freq: Four times a day (QID) | ORAL | Status: DC | PRN

## 2020-10-08 MED ORDER — FENTANYL 2500MCG IN NS 250ML (10MCG/ML) PREMIX INFUSION
25.0000 ug/h | INTRAVENOUS | Status: DC
  Administered 2020-10-08 (×2): 25 ug/h via INTRAVENOUS
  Administered 2020-10-11: 50 ug/h via INTRAVENOUS
  Administered 2020-10-12: 100 ug/h via INTRAVENOUS
  Filled 2020-10-08 (×4): qty 250

## 2020-10-08 MED ORDER — SODIUM CHLORIDE 0.9 % IV SOLN
2.0000 g | Freq: Two times a day (BID) | INTRAVENOUS | Status: DC
  Administered 2020-10-08 – 2020-10-09 (×2): 2 g via INTRAVENOUS
  Filled 2020-10-08 (×2): qty 2

## 2020-10-08 MED ORDER — NOREPINEPHRINE 4 MG/250ML-% IV SOLN
0.0000 ug/min | INTRAVENOUS | Status: DC
  Administered 2020-10-08: 24 ug/min via INTRAVENOUS
  Administered 2020-10-08: 22 ug/min via INTRAVENOUS
  Administered 2020-10-08: 20 ug/min via INTRAVENOUS
  Administered 2020-10-09 (×2): 23 ug/min via INTRAVENOUS
  Administered 2020-10-09: 21 ug/min via INTRAVENOUS
  Filled 2020-10-08 (×5): qty 250

## 2020-10-08 MED ORDER — TRAMADOL HCL 50 MG PO TABS: 50.0000 mg | ORAL_TABLET | Freq: Four times a day (QID) | ORAL | Status: DC | PRN

## 2020-10-08 MED ORDER — NALOXONE HCL 0.4 MG/ML IJ SOLN: 0.4000 mg | Freq: Once | INTRAMUSCULAR | Status: AC

## 2020-10-08 MED ORDER — VANCOMYCIN HCL 1000 MG/200ML IV SOLN: 1000.0000 mg | INTRAVENOUS | Status: DC

## 2020-10-08 MED ORDER — HEPARIN (PORCINE) 25000 UT/250ML-% IV SOLN
1200.0000 [IU]/h | INTRAVENOUS | Status: DC
  Administered 2020-10-08: 1300 [IU]/h via INTRAVENOUS
  Administered 2020-10-09: 1200 [IU]/h via INTRAVENOUS
  Filled 2020-10-08 (×2): qty 250

## 2020-10-08 MED ORDER — ORAL CARE MOUTH RINSE
15.0000 mL | OROMUCOSAL | Status: DC
  Administered 2020-10-08 – 2020-10-21 (×126): 15 mL via OROMUCOSAL

## 2020-10-08 MED ORDER — HYDROCORTISONE NA SUCCINATE PF 100 MG IJ SOLR
50.0000 mg | Freq: Four times a day (QID) | INTRAMUSCULAR | Status: DC
  Administered 2020-10-08 – 2020-10-14 (×24): 50 mg via INTRAVENOUS
  Filled 2020-10-08 (×24): qty 2

## 2020-10-08 MED ORDER — FENTANYL CITRATE (PF) 100 MCG/2ML IJ SOLN: 25.0000 ug | Freq: Once | INTRAMUSCULAR | Status: DC

## 2020-10-08 MED ORDER — SODIUM CHLORIDE 0.9 % IV BOLUS
1000.0000 mL | Freq: Once | INTRAVENOUS | Status: AC
  Administered 2020-10-08: 1000 mL via INTRAVENOUS

## 2020-10-08 MED ORDER — NOREPINEPHRINE 4 MG/250ML-% IV SOLN: 2.0000 ug/min | INTRAVENOUS | Status: DC

## 2020-10-08 MED ORDER — METOPROLOL TARTRATE 5 MG/5ML IV SOLN
5.0000 mg | Freq: Once | INTRAVENOUS | Status: AC
  Administered 2020-10-08: 5 mg via INTRAVENOUS
  Filled 2020-10-08: qty 5

## 2020-10-08 MED ORDER — PROPRANOLOL HCL 10 MG PO TABS
10.0000 mg | ORAL_TABLET | Freq: Four times a day (QID) | ORAL | Status: DC
  Administered 2020-10-08: 10 mg
  Filled 2020-10-08: qty 1

## 2020-10-08 MED ORDER — NALOXONE HCL 0.4 MG/ML IJ SOLN
INTRAMUSCULAR | Status: AC
  Administered 2020-10-08: 0.4 mg via INTRAVENOUS
  Filled 2020-10-08: qty 1

## 2020-10-08 MED ORDER — FENTANYL CITRATE (PF) 100 MCG/2ML IJ SOLN: 100.0000 ug | Freq: Once | INTRAMUSCULAR | Status: AC

## 2020-10-08 MED ORDER — POLYETHYLENE GLYCOL 3350 17 G PO PACK
17.0000 g | PACK | Freq: Every day | ORAL | Status: DC
  Administered 2020-10-10 – 2020-10-19 (×8): 17 g
  Filled 2020-10-08 (×7): qty 1

## 2020-10-08 MED ORDER — DEXMEDETOMIDINE HCL IN NACL 400 MCG/100ML IV SOLN
0.0000 ug/kg/h | INTRAVENOUS | Status: AC
  Administered 2020-10-08 – 2020-10-09 (×2): 0.4 ug/kg/h via INTRAVENOUS
  Administered 2020-10-09: 0.6 ug/kg/h via INTRAVENOUS
  Administered 2020-10-09: 0.9 ug/kg/h via INTRAVENOUS
  Administered 2020-10-10: 0.5 ug/kg/h via INTRAVENOUS
  Administered 2020-10-10: 0.9 ug/kg/h via INTRAVENOUS
  Administered 2020-10-11: 0.5 ug/kg/h via INTRAVENOUS
  Filled 2020-10-08 (×8): qty 100

## 2020-10-08 MED ORDER — METOPROLOL TARTRATE 5 MG/5ML IV SOLN: 5.0000 mg | Freq: Once | INTRAVENOUS | Status: DC

## 2020-10-08 MED ORDER — FENTANYL BOLUS VIA INFUSION
25.0000 ug | INTRAVENOUS | Status: DC | PRN
Start: 2020-10-08 — End: 2020-10-21
  Administered 2020-10-09: 50 ug via INTRAVENOUS
  Administered 2020-10-09: 25 ug via INTRAVENOUS
  Administered 2020-10-13: 50 ug via INTRAVENOUS
  Administered 2020-10-14: 25 ug via INTRAVENOUS
  Filled 2020-10-08: qty 100

## 2020-10-08 MED ORDER — VASOPRESSIN 20 UNITS/100 ML INFUSION FOR SHOCK
0.0000 [IU]/min | INTRAVENOUS | Status: DC
  Administered 2020-10-08: 0.03 [IU]/min via INTRAVENOUS
  Administered 2020-10-08 – 2020-10-11 (×9): 0.04 [IU]/min via INTRAVENOUS
  Administered 2020-10-12: 0.03 [IU]/min via INTRAVENOUS
  Filled 2020-10-08 (×12): qty 100

## 2020-10-08 MED ORDER — CHLORHEXIDINE GLUCONATE 0.12% ORAL RINSE (MEDLINE KIT)
15.0000 mL | Freq: Two times a day (BID) | OROMUCOSAL | Status: DC
  Administered 2020-10-08 – 2020-10-20 (×25): 15 mL via OROMUCOSAL

## 2020-10-08 MED ORDER — PANTOPRAZOLE SODIUM 40 MG IV SOLR
40.0000 mg | Freq: Every day | INTRAVENOUS | Status: DC
  Administered 2020-10-09: 40 mg via INTRAVENOUS
  Filled 2020-10-08: qty 40

## 2020-10-08 MED ORDER — NOREPINEPHRINE 4 MG/250ML-% IV SOLN
INTRAVENOUS | Status: AC
  Administered 2020-10-08: 5 ug/min via INTRAVENOUS
  Filled 2020-10-08: qty 250
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
## 2020-10-08 NOTE — Procedures (Signed)
Central Venous Catheter Insertion Procedure Note  MATTI MINNEY  [id number]  09-05-1945  Date:10/08/20  Time:4:43 PM   Provider Performing:Francetta Ilg [name]   Procedure: Insertion of Non-tunneled Central Venous [phone]) with US guidance (42595)   Indication(s) Medication administration  Consent Risks of the procedure as well as the alternatives and risks of each were explained to the patient and/or caregiver.  Consent for the procedure was obtained and is signed in the bedside chart  Anesthesia see mar  Timeout Verified patient identification, verified procedure, site/side was marked, verified correct patient position, special equipment/implants available, medications/allergies/relevant history reviewed, required imaging and test results available.  Sterile Technique Maximal sterile technique including full sterile barrier drape, hand hygiene, sterile gown, sterile gloves, mask, hair covering, sterile ultrasound probe cover (if used).  Procedure Description Area of catheter insertion was cleaned with chlorhexidine and draped in sterile fashion.  With real-time ultrasound guidance a central venous catheter was placed into the left femoral vein , secondary to resp failure on escalated vent settings, need for heparin (coagulopathy) and hard c collar.  Nonpulsatile blood flow and easy flushing noted in all ports.  The catheter was sutured in place and sterile dressing applied.  Complications/Tolerance None; patient tolerated the procedure well. Chest X-ray is ordered to verify placement for internal jugular or subclavian cannulation.   Chest x-ray is not ordered for femoral cannulation.  EBL Minimal  Specimen(s) None    Line was done under u/s guidance. Easily compressible vein noted with neighboring pulsatile artery. Upon stick dark red non pulsatile blood was noted. Wire easily advanced. Wire was verified to be in easily compressible/non pulsatile vessel in both cross  sectional and longitudinal views under ultrasound guidance. Skin was easily dilated and catheter advanced without issue. Wire was withdrawn from vessel. No air was aspirated thru entirety of the procedure. Pt tolerated well. No complications were appreciated.

## 2020-10-08 NOTE — Progress Notes (Signed)
Inpatient Rehab Admissions Coordinator:   Pt. Is not currently medically stable for CIR. I did open a case with Pt.'s insurance yesterday, but have not yet received authorization. I will continue to follow for potential admit pending medical readiness, bed availability, and insurance auth. Daughter updated over the phone.   Clemens Catholic, [place], [place] Admissions Coordinator  [phone] ([place]) [phone] (office)
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
## 2020-10-08 NOTE — Significant Event (Signed)
Rapid Response Event Note   Reason for Call :  Need for intubation  Initial Focused Assessment:  Patient mostly somnolent.  Congested but unable to manage secretions/poor cough.  Lung sounds with rhonchi.  BP 138/91  HR 118  O2 sat difficult to obtain CBG 232  Dr [name] and Shirlee Limerick at bedside Interventions:  2mg  Versed and 30 mg Etomidate given IV Patient intubated  Placed on Ventilator 169mcg Fentanyl given PCXR done   BP 71/52  HR 103 Fluid Bolus  Started Levo at 67mcg  Via PIV  Transported to radiology for head CT Transported to 4N ICU Patient agitated 2mg  Versed and 114mcg Fentanyl given IV Hand off with ICU staff  Plan of Care:     Event Summary:   MD Notified: Dr [name] at bedside Call Time: 1252  Arrival Time: 1555 End Time:  [place]  Raliegh Ip, RN

## 2020-10-08 NOTE — Progress Notes (Signed)
Patient current SBP=140/114, MAP==122, HR=130 sustained.RP=44. [facility] paged again. No response from [facility].
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
## 2020-10-08 NOTE — Progress Notes (Addendum)
HOSPITAL MEDICINE OVERNIGHT EVENT NOTE    Notified by nursing that patient is exhibiting extreme tachycardia up to 150 beats per minute in the form of rapid atrial fibrillation for the past several hours.  Patient is not complaining of any pain nor is he in respiratory distress.  Patient is hemodynamically stable.  Chart reviewed, patient is suffering from severe hyperthyroidism and has newly been diagnosed with atrial fibrillation during this hospitalization.  Patient has been managed with propranolol up to this point.  Ordering 5 mg of intravenous metoprolol now and will reassess.  Upon further chart review it seems that patient has exhibited a fever of 100.4 F in the early morning hours.  Will obtain urinalysis, chest x-ray and blood cultures to evaluate for any underlying sources of infection.  Will follow patient closely to ensure rate control is achieved.  Vernelle Emerald  MD [facility]   ADDENDUM (5/24 6:05am)  Heart rate has improved S/P Metoprolol to the 80's to 90's.  Patient still in afib and hemodynamically stable.    Sherryll Burger Jaideep Pollack

## 2020-10-08 NOTE — Progress Notes (Signed)
Pt has sustained HR in the 150s. SBP elevated. Respirations elevated in the 30s. [facility] paged 2x. Awaiting response from MD. Will continue monitoring.

## 2020-10-08 NOTE — Care Management Important Message (Signed)
Important Message  Patient Details  Name: Samuel Barry MRN: [id number] Date of Birth: [birth year]   Medicare Important Message Given:  Yes     Ashan Cueva Montine Circle 10/08/2020, 4:07 PM

## 2020-10-08 NOTE — Progress Notes (Signed)
[place] for heparin  Indication: CVA and afib  Allergies  Allergen Reactions  . Doxycycline Monohydrate Itching    *was taking two antibiotic at same time   . Sulfamethoxazole-Trimethoprim Itching    *was taking antibiotics at same time.  Marland Kitchen Penicillin G Rash    Patient Measurements: Height: 5' 8.25" (173.4 cm) Weight: 74.4 kg (164 lb 0.4 oz) IBW/kg (Calculated) : 68.98  Heparin Dosing Weight: 74 kg  Vital Signs: Temp: 98.4 F (36.9 C) (05/24 1600) Temp Source: Axillary (05/24 1600) BP: 111/58 (05/24 1449) Pulse Rate: 105 (05/24 1449)  Labs: Recent Labs    10/06/20 0201 10/06/20 1050 10/06/20 2042 10/07/20 0422 10/08/20 0343 10/08/20 0601 10/08/20 1303 10/08/20 1447  HGB 14.1  --   --  14.9 15.2  --  11.5* 10.5*  HCT 43.1  --   --  45.3 46.1  --  36.3* 31.0*  PLT 175  --   --  202 211  --  198  --   HEPARINUNFRC <0.10*   < > 0.36 0.32 0.30  --   --   --   CREATININE 0.89  --   --  0.90  --  1.36*  --   --    < > = values in this interval not displayed.    Estimated Creatinine Clearance: 45.8 mL/min (A) (by C-G formula based on SCr of 1.36 mg/dL (H)).    Assessment: 75 yo male with CVA likely due to Afib. Pharmacy consults to dose heparin and plans are noted for apixaban later.  Heparin turned off earlier today 5/24.  Patient decompensated, requiring intubation and pressors.  CT revealed new, acute infarction and Pharmacy consulted to resume IV heparin.  Heparin level was at the low end of the goal range on 1250 units/hr and rate was increased this AM.  No bleeding reported.   Goal of Therapy:  Heparin level 0.3-0.5 units/ml Monitor platelets by anticoagulation protocol: Yes   Plan:  Resume heparin gtt at 1300 units/hr Daily heparin level and CBC  Jamyiah Labella D. Mina Marble, PharmD, BCPS, [place] 10/08/2020, 7:53 PM

## 2020-10-08 NOTE — Progress Notes (Signed)
Pt with deteriorating BP's. On peripheral dose levo but escalating. Suspect 2/2 afib (despite ventricular response only in low 100's).   -placing cvc -adding vaso -adding stress steroids  Pt also has new acute cva, clarified with neuro ok to resume heparin.  Pharmacy consult place to redose.    Lengthy discussion with family who is appropriately tearful at this time regarding their father's decompensation. We addressed his new cva and renal failure despite being therapeutic on heparin gtt.  Concern for emboli event.   Family acknowledges pt deterioration andc ritical illness. They want to cont with aggressive care at this juncture but should his heart stop they are transitioning him to DNR and would want him to pass peacefully  Rn at bedside and updated

## 2020-10-08 NOTE — Plan of Care (Signed)
  Problem: Education: Goal: Knowledge of disease or condition will improve Outcome: Progressing Goal: Knowledge of secondary prevention will improve Outcome: Progressing Goal: Knowledge of patient specific risk factors addressed and post discharge goals established will improve Outcome: Progressing Goal: Individualized Educational Video(s) Outcome: Progressing   Problem: Coping: Goal: Will verbalize positive feelings about self Outcome: Progressing Goal: Will identify appropriate support needs Outcome: Progressing   Problem: Health Behavior/Discharge Planning: Goal: Ability to manage health-related needs will improve Outcome: Progressing   Problem: Self-Care: Goal: Ability to participate in self-care as condition permits will improve Outcome: Progressing Goal: Verbalization of feelings and concerns over difficulty with self-care will improve Outcome: Progressing Goal: Ability to communicate needs accurately will improve Outcome: Progressing   Problem: Nutrition: Goal: Risk of aspiration will decrease Outcome: Progressing Goal: Dietary intake will improve Outcome: Progressing   Problem: Ischemic Stroke/TIA Tissue Perfusion: Goal: Complications of ischemic stroke/TIA will be minimized Outcome: Progressing   Problem: Safety: Goal: Non-violent Restraint(s) Outcome: Progressing

## 2020-10-08 NOTE — Progress Notes (Addendum)
PCCM interval progress note  75yo M emergently intubated by PCCM 5/25 with poor airway protection in setting of worsening mental status. Sent for STAT CT H  CT H personally reviewed -- reveals new acute R occipital infarct. Subacute L posterior parietal infarct (seen on prior imaging) Chronic small vessel ischemic changes (seen on prior imaging)  Dr. [name] CCM notified  Will notify stroke service as well (already following)    Eliseo Gum MSN, AGACNP-BC [place] for pager  10/08/2020, 2:39 PM

## 2020-10-08 NOTE — Progress Notes (Signed)
PT Cancellation Note  Patient Details Name: Samuel Barry MRN: [id number] DOB: Jan 11, 1946   Cancelled Treatment:    Reason Eval/Treat Not Completed: Medical issues which prohibited therapy RR up to 57 at rest. Pt not responding to stimuli provided by therapist. RN aware.  Wyona Almas, PT, DPT Acute Rehabilitation Services Pager [phone] Office [phone]    Deno Etienne 10/08/2020, 10:14 AM
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
## 2020-10-08 NOTE — Procedures (Signed)
Intubation Procedure Note  Samuel Barry  [id number]  1946/04/12  Date:10/08/20  Time:1:31 PM   Provider Performing:Samuel Barry [name]    Procedure: Intubation (31500)  Indication(s) Respiratory Failure  Consent Risks of the procedure as well as the alternatives and risks of each were explained to the patient and/or caregiver.  Consent for the procedure was obtained and is signed in the bedside chart   Anesthesia Etomidate, Versed and see mar   Time Out Verified patient identification, verified procedure, site/side was marked, verified correct patient position, special equipment/implants available, medications/allergies/relevant history reviewed, required imaging and test results available.   Sterile Technique Usual hand hygeine, masks, and gloves were used   Procedure Description Patient positioned in bed supine.  Sedation given as noted above.  Patient was intubated with endotracheal tube using Glidescope.  View was Grade 2 only posterior commissure .  Number of attempts was 1.  Colorimetric CO2 detector was consistent with tracheal placement.   Complications/Tolerance None; patient tolerated the procedure well. Chest X-ray is ordered to verify placement.   EBL none   Specimen(s) None   Pt tolerated will no complications appreciated.  sats remained >95% throughout.  cxr with good positioning.

## 2020-10-08 NOTE — Progress Notes (Signed)
[place] for heparin  Indication: CVA and afib  Allergies  Allergen Reactions  . Doxycycline Monohydrate Itching    *was taking two antibiotic at same time   . Sulfamethoxazole-Trimethoprim Itching    *was taking antibiotics at same time.  Marland Kitchen Penicillin G Rash    Patient Measurements: Height: 5' 8.25" (173.4 cm) Weight: 74.4 kg (164 lb 0.4 oz) IBW/kg (Calculated) : 68.98  Heparin Dosing Weight: 74 kg   Vital Signs: Temp: 99.1 F (37.3 C) (05/24 0710) Temp Source: Axillary (05/24 0710) BP: 106/73 (05/24 0710) Pulse Rate: 95 (05/24 0830)  Labs: Recent Labs    10/06/20 0201 10/06/20 1050 10/06/20 2042 10/07/20 0422 10/08/20 0343 10/08/20 0601  HGB 14.1  --   --  14.9 15.2  --   HCT 43.1  --   --  45.3 46.1  --   PLT 175  --   --  202 211  --   HEPARINUNFRC <0.10*   < > 0.36 0.32 0.30  --   CREATININE 0.89  --   --  0.90  --  1.36*   < > = values in this interval not displayed.    Estimated Creatinine Clearance: 45.8 mL/min (A) (by C-G formula based on SCr of 1.36 mg/dL (H)).    Assessment: 75 yo male with CVA likely due to afib. Pharmacy consults to dose heparin and plans are noted for apixaban later  Heparin level is at low end of the goal range on 1250 units/hr. Hgb and plts stable   Goal of Therapy:  Heparin level 0.3-0.5 units/ml Monitor platelets by anticoagulation protocol: Yes   Plan:  -Increase heparin to 1300 units/hr -Daily heparin level and CBC  Alanda Slim, PharmD, [facility] Clinical Pharmacist Please see AMION for all Pharmacists' Contact Phone Numbers 10/08/2020, 9:37 AM

## 2020-10-08 NOTE — Progress Notes (Signed)
RT assisted with transportation of this pt from 4NP6 to CT while on full ventilatory support. Pt is now in room 4N26. Pt tolerated well with no complications and SVS. RN at bedside at this time.

## 2020-10-08 NOTE — Progress Notes (Signed)
MD responded, ordered Metoprolol 5mg  IV push,Stat EKG, Urinalysis,Urine culture, chest X-ray, and Blood culture. HR and SBP improved s/p metoprolol to the 80's to 100's. Patient however still remains in Afib.

## 2020-10-08 NOTE — Progress Notes (Addendum)
eLink Physician-Brief Progress Note Patient Name: Samuel Barry DOB: 09-Mar-1946 MRN: [id number]   Date of Service  10/08/2020  HPI/Events of Note  Pharmacy confusion about restarting Heparin IV infusion. Heparin IV infusion for acute stroke/AFIB held d/t MS change and concern for hemorrhage. Head CT Scan from this afternoon with a new ischemic infarct without hemorrhage and subacute ischemic infarct without hemorrhagic conversion. From Dr. Marcheta Grammes note from 4:51 PM noted that Neurology was OK with resuming Heparin IV infusion.  eICU Interventions  Will resume Heparin IV infusion per pharmacy consult. Stroke protocol.     Intervention Category Major Interventions: Other:  Lysle Dingwall 10/08/2020, 7:49 PM

## 2020-10-09 DIAGNOSIS — R069 Unspecified abnormalities of breathing: Secondary | ICD-10-CM | POA: Diagnosis not present

## 2020-10-09 DIAGNOSIS — I6300 Cerebral infarction due to thrombosis of unspecified precerebral artery: Secondary | ICD-10-CM | POA: Diagnosis not present

## 2020-10-09 LAB — HEPATIC FUNCTION PANEL
ALT: 385 U/L — ABNORMAL HIGH (ref 0–44)
AST: 315 U/L — ABNORMAL HIGH (ref 15–41)
Albumin: 2.2 g/dL — ABNORMAL LOW (ref 3.5–5.0)
Alkaline Phosphatase: 62 U/L (ref 38–126)
Bilirubin, Direct: 0.4 mg/dL — ABNORMAL HIGH (ref 0.0–0.2)
Indirect Bilirubin: 1.2 mg/dL — ABNORMAL HIGH (ref 0.3–0.9)
Total Bilirubin: 1.6 mg/dL — ABNORMAL HIGH (ref 0.3–1.2)
Total Protein: 5.4 g/dL — ABNORMAL LOW (ref 6.5–8.1)

## 2020-10-09 LAB — BLOOD CULTURE ID PANEL (REFLEXED) - BCID2

## 2020-10-09 LAB — BASIC METABOLIC PANEL
Anion gap: 12 (ref 5–15)
Anion gap: 11 (ref 5–15)
BUN: 72 mg/dL — ABNORMAL HIGH (ref 8–23)
BUN: 84 mg/dL — ABNORMAL HIGH (ref 8–23)
CO2: 20 mmol/L — ABNORMAL LOW (ref 22–32)
CO2: 18 mmol/L — ABNORMAL LOW (ref 22–32)
Calcium: 7.5 mg/dL — ABNORMAL LOW (ref 8.9–10.3)
Calcium: 7.4 mg/dL — ABNORMAL LOW (ref 8.9–10.3)
Chloride: 103 mmol/L (ref 98–111)
Chloride: 105 mmol/L (ref 98–111)
Creatinine, Ser: 4.32 mg/dL — ABNORMAL HIGH (ref 0.61–1.24)
Creatinine, Ser: 5.26 mg/dL — ABNORMAL HIGH (ref 0.61–1.24)
GFR, Estimated: 14 mL/min — ABNORMAL LOW (ref >60)
GFR, Estimated: 11 mL/min — ABNORMAL LOW (ref >60)
Glucose, Bld: 228 mg/dL — ABNORMAL HIGH (ref 70–99)
Glucose, Bld: 125 mg/dL — ABNORMAL HIGH (ref 70–99)
Potassium: 5.9 mmol/L — ABNORMAL HIGH (ref 3.5–5.1)
Potassium: 5.9 mmol/L — ABNORMAL HIGH (ref 3.5–5.1)
Sodium: 135 mmol/L (ref 135–145)
Sodium: 134 mmol/L — ABNORMAL LOW (ref 135–145)

## 2020-10-09 LAB — GLUCOSE, CAPILLARY
Glucose-Capillary: 124 mg/dL — ABNORMAL HIGH (ref 70–99)
Glucose-Capillary: 139 mg/dL — ABNORMAL HIGH (ref 70–99)
Glucose-Capillary: 151 mg/dL — ABNORMAL HIGH (ref 70–99)
Glucose-Capillary: 159 mg/dL — ABNORMAL HIGH (ref 70–99)
Glucose-Capillary: 171 mg/dL — ABNORMAL HIGH (ref 70–99)
Glucose-Capillary: 242 mg/dL — ABNORMAL HIGH (ref 70–99)
Glucose-Capillary: 92 mg/dL (ref 70–99)

## 2020-10-09 LAB — URINE CULTURE: Culture: NO GROWTH

## 2020-10-09 LAB — CBC
HCT: 29 % — ABNORMAL LOW (ref 39.0–52.0)
Hemoglobin: 9.4 g/dL — ABNORMAL LOW (ref 13.0–17.0)
MCH: 29.4 pg (ref 26.0–34.0)
MCHC: 32.4 g/dL (ref 30.0–36.0)
MCV: 90.6 fL (ref 80.0–100.0)
Platelets: 192 10*3/uL (ref 150–400)
RBC: 3.2 MIL/uL — ABNORMAL LOW (ref 4.22–5.81)
RDW: 13.2 % (ref 11.5–15.5)
WBC: 31.2 10*3/uL — ABNORMAL HIGH (ref 4.0–10.5)
nRBC: 0 % (ref 0.0–0.2)

## 2020-10-09 LAB — HEPARIN LEVEL (UNFRACTIONATED)
Heparin Unfractionated: 0.72 IU/mL — ABNORMAL HIGH (ref 0.30–0.70)
Heparin Unfractionated: 0.82 IU/mL — ABNORMAL HIGH (ref 0.30–0.70)

## 2020-10-09 MED ORDER — SODIUM CHLORIDE 0.9% FLUSH: 10.0000 mL | INTRAVENOUS | Status: DC | PRN

## 2020-10-09 MED ORDER — SODIUM CHLORIDE 0.9 % IV SOLN
1.0000 g | INTRAVENOUS | Status: DC
  Administered 2020-10-10 – 2020-10-11 (×2): 1 g via INTRAVENOUS
  Filled 2020-10-09 (×2): qty 1

## 2020-10-09 MED ORDER — SODIUM ZIRCONIUM CYCLOSILICATE 10 G PO PACK
10.0000 g | PACK | Freq: Three times a day (TID) | ORAL | Status: DC
  Administered 2020-10-09 – 2020-10-10 (×3): 10 g
  Filled 2020-10-09 (×4): qty 1

## 2020-10-09 MED ORDER — SODIUM ZIRCONIUM CYCLOSILICATE 10 G PO PACK
10.0000 g | PACK | Freq: Two times a day (BID) | ORAL | Status: DC
  Administered 2020-10-09: 10 g via ORAL
  Filled 2020-10-09 (×2): qty 1

## 2020-10-09 MED ORDER — SODIUM BICARBONATE 8.4 % IV SOLN
100.0000 meq | Freq: Once | INTRAVENOUS | Status: AC
  Administered 2020-10-09: 100 meq via INTRAVENOUS
  Filled 2020-10-09: qty 50

## 2020-10-09 MED ORDER — SODIUM ZIRCONIUM CYCLOSILICATE 10 G PO PACK
10.0000 g | PACK | Freq: Three times a day (TID) | ORAL | Status: DC
  Filled 2020-10-09 (×2): qty 1

## 2020-10-09 MED ORDER — VANCOMYCIN VARIABLE DOSE PER UNSTABLE RENAL FUNCTION (PHARMACIST DOSING): Status: DC

## 2020-10-09 MED ORDER — SODIUM CHLORIDE 0.9% FLUSH
10.0000 mL | Freq: Two times a day (BID) | INTRAVENOUS | Status: DC
  Administered 2020-10-09: 10 mL
  Administered 2020-10-10 – 2020-10-11 (×3): 30 mL
  Administered 2020-10-12 – 2020-10-19 (×14): 10 mL
  Administered 2020-10-20: 20 mL

## 2020-10-09 MED ORDER — HEPARIN (PORCINE) 25000 UT/250ML-% IV SOLN
1050.0000 [IU]/h | INTRAVENOUS | Status: DC
  Administered 2020-10-10: 950 [IU]/h via INTRAVENOUS
  Filled 2020-10-09: qty 250

## 2020-10-09 MED ORDER — NOREPINEPHRINE 16 MG/250ML-% IV SOLN
0.0000 ug/min | INTRAVENOUS | Status: AC
  Administered 2020-10-09: 20 ug/min via INTRAVENOUS
  Administered 2020-10-10: 10 ug/min via INTRAVENOUS
  Filled 2020-10-09 (×3): qty 250

## 2020-10-09 NOTE — Progress Notes (Signed)
PT Cancellation Note  Patient Details Name: KRISTINE TILEY MRN: [id number] DOB: Oct 01, 1945   Cancelled Treatment:    Reason Eval/Treat Not Completed: Medical issues which prohibited therapy (still intubated after acute change in mental status).  Wyona Almas, PT, DPT Acute Rehabilitation Services Pager [phone] Office [phone]    Deno Etienne 10/09/2020, 8:20 AM

## 2020-10-09 NOTE — Progress Notes (Signed)
Speech Language Pathology Discharge Patient Details Name: Samuel Barry MRN: [id number] DOB: 01-09-1946 Today's Date: 10/09/2020 Time:  -     Patient discharged from SLP services secondary to medical decline - will need to re-order SLP to resume therapy services. Pt re-intubated. Will discharge ST. Please re-order if/when needed.   Please see latest therapy progress note for current level of functioning and progress toward goals.    Progress and discharge plan discussed with patient and/or caregiver: Patient unable to participate in discharge planning and no caregivers available  GO     Houston Siren 10/09/2020, 11:01 AM  Orbie Pyo Colvin Caroli.Ed Risk analyst [phone] Office [phone]
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
## 2020-10-09 NOTE — Progress Notes (Signed)
ANTICOAGULATION CONSULT NOTE  Pharmacy Consult:  Heparin Indication: CVA and Afib  Allergies  Allergen Reactions  . Doxycycline Monohydrate Itching    *was taking two antibiotic at same time   . Sulfamethoxazole-Trimethoprim Itching    *was taking antibiotics at same time.  Marland Kitchen Penicillin G Rash    Patient Measurements: Height: 5' 8.25" (173.4 cm) Weight: 74.8 kg (164 lb 14.5 oz) IBW/kg (Calculated) : 68.98  Heparin Dosing Weight: 74 kg  Vital Signs: Temp: 98.8 F (37.1 C) (05/25 1200) Temp Source: Oral (05/25 1200) BP: 105/70 (05/25 1530) Pulse Rate: 92 (05/25 1530)  Labs: Recent Labs    10/08/20 0343 10/08/20 0601 10/08/20 1303 10/08/20 1447 10/09/20 0423 10/09/20 1432  HGB 15.2  --  11.5* 10.5* 9.4*  --   HCT 46.1  --  36.3* 31.0* 29.0*  --   PLT 211  --  198  --  192  --   HEPARINUNFRC 0.30  --   --   --  0.72* 0.82*  CREATININE  --  1.36*  --   --  4.32* 5.26*    Estimated Creatinine Clearance: 11.8 mL/min (A) (by C-G formula based on SCr of 5.26 mg/dL (H)).    Assessment: 75 yo male with CVA likely due to Afib.  He also developed a new, acute infarct on 5/24 CT.  Pharmacy consults to dose heparin.  Heparin level trended up further to 0.82 units/mL despite rate reduction this AM.  Lab was drawn form the central line and heparin is infusing through the pIV.  No bleeding per RN.  Goal of Therapy:  Heparin level 0.3-0.5 units/ml Monitor platelets by anticoagulation protocol: Yes   Plan:  Hold heparin infusion for 30 min - RN aware, then Resume heparin gtt at 950 units/hr Check 8 hr heparin level  Zareena Willis D. Mina Marble, PharmD, BCPS, [place] 10/09/2020, 3:47 PM

## 2020-10-09 NOTE — Progress Notes (Signed)
[place] for heparin  Indication: CVA and afib  Allergies  Allergen Reactions  . Doxycycline Monohydrate Itching    *was taking two antibiotic at same time   . Sulfamethoxazole-Trimethoprim Itching    *was taking antibiotics at same time.  Marland Kitchen Penicillin G Rash    Patient Measurements: Height: 5' 8.25" (173.4 cm) Weight: 74.4 kg (164 lb 0.4 oz) IBW/kg (Calculated) : 68.98  Heparin Dosing Weight: 74 kg  Vital Signs: Temp: 100 F (37.8 C) (05/25 0400) Temp Source: Axillary (05/25 0400) BP: 117/66 (05/25 0430) Pulse Rate: 92 (05/25 0430)  Labs: Recent Labs    10/07/20 0422 10/08/20 0343 10/08/20 0601 10/08/20 1303 10/08/20 1447 10/09/20 0423  HGB 14.9 15.2  --  11.5* 10.5* 9.4*  HCT 45.3 46.1  --  36.3* 31.0* 29.0*  PLT 202 211  --  198  --  192  HEPARINUNFRC 0.32 0.30  --   --   --  0.72*  CREATININE 0.90  --  1.36*  --   --   --     Estimated Creatinine Clearance: 45.8 mL/min (A) (by C-G formula based on SCr of 1.36 mg/dL (H)).    Assessment: 75 yo male with CVA likely due to Afib. Pharmacy consults to dose heparin and plans are noted for apixaban later.  Heparin turned off earlier today 5/24.  Patient decompensated, requiring intubation and pressors.  CT revealed new, acute infarction and Pharmacy consulted to resume IV heparin.  Heparin level now up to supratherapeutic (0.72) on gtt at 1300 units/hr. No bleeding noted per RN. Hgb down to 9.4. Heparin running in central line but RN held and flushed well - still could have some heparin contamination. RN to move the heparin to the peripheral IV site so labs can be drawn centrally with no contamination.  Goal of Therapy:  Heparin level 0.3-0.5 units/ml Monitor platelets by anticoagulation protocol: Yes   Plan:  Decrease heparin gtt to 1200 units/hr F/u 8 hr heparin level  Sherlon Handing, PharmD, BCPS Please see amion for complete clinical pharmacist phone list 10/09/2020,  5:02 AM
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
## 2020-10-09 NOTE — Progress Notes (Signed)
Inpatient Rehab Admissions Coordinator:   Pt. Intubated, on vent at this time. Pt. Is not appropriate for CIR at this time, so I will sign off. MD may re-consult if medical status changes and pt. Becomes able to participate with therapies.   Clemens Catholic, [place], [place] Admissions Coordinator  [phone] ([place]) [phone] (office)

## 2020-10-09 NOTE — Progress Notes (Signed)
Dr. [name] gave order to renew restraints.

## 2020-10-09 NOTE — Progress Notes (Signed)
OT Cancellation Note  Patient Details Name: Samuel Barry MRN: [id number] DOB: Dec 05, 1945   Cancelled Treatment:    Reason Eval/Treat Not Completed: Patient not medically ready;Medical issues which prohibited therapy (Pt with change in medical status- re-intubated, new CVA found and not medically appropriate for OT intervention at this time. OT following.)   Jefferey Pica, OTR/L Acute Rehabilitation Services Pager: [phone] Office: [phone]  Jefferey Pica 10/09/2020, 8:39 AM

## 2020-10-09 NOTE — Progress Notes (Signed)
Dr. [name] present and gave order to discontinue NS IVF and gave order to restart tube feedings.
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
## 2020-10-09 NOTE — Progress Notes (Signed)
Spoke with Dr. [name] and made MD aware that potassium remains 5.9, MD acknowledged and placed orders for bicarb and Lokelma.

## 2020-10-09 NOTE — Progress Notes (Signed)
Dr. [name] gave order to discontinue free water flushes.

## 2020-10-10 ENCOUNTER — Inpatient Hospital Stay (HOSPITAL_COMMUNITY): Payer: Medicare HMO

## 2020-10-10 DIAGNOSIS — R4182 Altered mental status, unspecified: Secondary | ICD-10-CM | POA: Diagnosis not present

## 2020-10-10 DIAGNOSIS — I6300 Cerebral infarction due to thrombosis of unspecified precerebral artery: Secondary | ICD-10-CM | POA: Diagnosis not present

## 2020-10-10 DIAGNOSIS — R069 Unspecified abnormalities of breathing: Secondary | ICD-10-CM | POA: Diagnosis not present

## 2020-10-10 LAB — GLUCOSE, CAPILLARY
Glucose-Capillary: 176 mg/dL — ABNORMAL HIGH (ref 70–99)
Glucose-Capillary: 187 mg/dL — ABNORMAL HIGH (ref 70–99)
Glucose-Capillary: 194 mg/dL — ABNORMAL HIGH (ref 70–99)
Glucose-Capillary: 200 mg/dL — ABNORMAL HIGH (ref 70–99)
Glucose-Capillary: 242 mg/dL — ABNORMAL HIGH (ref 70–99)
Glucose-Capillary: 279 mg/dL — ABNORMAL HIGH (ref 70–99)

## 2020-10-10 LAB — BASIC METABOLIC PANEL
Anion gap: 12 (ref 5–15)
BUN: 104 mg/dL — ABNORMAL HIGH (ref 8–23)
CO2: 21 mmol/L — ABNORMAL LOW (ref 22–32)
Calcium: 6.8 mg/dL — ABNORMAL LOW (ref 8.9–10.3)
Chloride: 105 mmol/L (ref 98–111)
Creatinine, Ser: 5.42 mg/dL — ABNORMAL HIGH (ref 0.61–1.24)
GFR, Estimated: 10 mL/min — ABNORMAL LOW (ref >60)
Glucose, Bld: 239 mg/dL — ABNORMAL HIGH (ref 70–99)
Potassium: 4.8 mmol/L (ref 3.5–5.1)
Sodium: 138 mmol/L (ref 135–145)

## 2020-10-10 LAB — CBC
HCT: 21.8 % — ABNORMAL LOW (ref 39.0–52.0)
Hemoglobin: 7.2 g/dL — ABNORMAL LOW (ref 13.0–17.0)
MCH: 29.4 pg (ref 26.0–34.0)
MCHC: 33 g/dL (ref 30.0–36.0)
MCV: 89 fL (ref 80.0–100.0)
Platelets: 188 10*3/uL (ref 150–400)
RBC: 2.45 MIL/uL — ABNORMAL LOW (ref 4.22–5.81)
RDW: 13.3 % (ref 11.5–15.5)
WBC: 23 10*3/uL — ABNORMAL HIGH (ref 4.0–10.5)
nRBC: 0 % (ref 0.0–0.2)

## 2020-10-10 LAB — HEPARIN LEVEL (UNFRACTIONATED)
Heparin Unfractionated: 0.38 IU/mL (ref 0.30–0.70)
Heparin Unfractionated: 0.46 IU/mL (ref 0.30–0.70)

## 2020-10-10 LAB — VANCOMYCIN, RANDOM: Vancomycin Rm: 20

## 2020-10-10 MED ORDER — PANTOPRAZOLE SODIUM 40 MG PO PACK
40.0000 mg | PACK | Freq: Every day | ORAL | Status: DC
  Administered 2020-10-10 – 2020-10-20 (×11): 40 mg
  Filled 2020-10-10 (×11): qty 20

## 2020-10-10 MED ORDER — INSULIN ASPART 100 UNIT/ML IJ SOLN
0.0000 [IU] | INTRAMUSCULAR | Status: DC
  Administered 2020-10-10 (×2): 3 [IU] via SUBCUTANEOUS
  Administered 2020-10-10: 5 [IU] via SUBCUTANEOUS
  Administered 2020-10-10 – 2020-10-11 (×2): 3 [IU] via SUBCUTANEOUS
  Administered 2020-10-11: 8 [IU] via SUBCUTANEOUS
  Administered 2020-10-11: 3 [IU] via SUBCUTANEOUS
  Administered 2020-10-11: 5 [IU] via SUBCUTANEOUS
  Administered 2020-10-11 – 2020-10-12 (×5): 3 [IU] via SUBCUTANEOUS
  Administered 2020-10-12: 2 [IU] via SUBCUTANEOUS
  Administered 2020-10-12 – 2020-10-13 (×5): 3 [IU] via SUBCUTANEOUS
  Administered 2020-10-13: 2 [IU] via SUBCUTANEOUS
  Administered 2020-10-13: 3 [IU] via SUBCUTANEOUS
  Administered 2020-10-13: 2 [IU] via SUBCUTANEOUS
  Administered 2020-10-14: 3 [IU] via SUBCUTANEOUS
  Administered 2020-10-14: 2 [IU] via SUBCUTANEOUS
  Administered 2020-10-14: 3 [IU] via SUBCUTANEOUS

## 2020-10-10 MED ORDER — CALCIUM GLUCONATE-NACL 1-0.675 GM/50ML-% IV SOLN
1.0000 g | Freq: Once | INTRAVENOUS | Status: AC
  Administered 2020-10-10: 1000 mg via INTRAVENOUS
  Filled 2020-10-10: qty 50

## 2020-10-10 MED ORDER — VANCOMYCIN HCL 1000 MG/200ML IV SOLN
1000.0000 mg | Freq: Once | INTRAVENOUS | Status: AC
  Administered 2020-10-10: 1000 mg via INTRAVENOUS
  Filled 2020-10-10: qty 200

## 2020-10-10 MED ORDER — PHENYLEPHRINE CONCENTRATED 100MG/250ML (0.4 MG/ML) INFUSION SIMPLE
0.0000 ug/min | INTRAVENOUS | Status: DC
  Administered 2020-10-10: 20 ug/min via INTRAVENOUS
  Administered 2020-10-11: 130 ug/min via INTRAVENOUS
  Filled 2020-10-10 (×3): qty 250

## 2020-10-10 NOTE — Progress Notes (Signed)
OT Cancellation Note  Patient Details Name: Samuel Barry MRN: [id number] DOB: [birth year]   Cancelled Treatment:    Reason Eval/Treat Not Completed: Medical issues which prohibited therapy.  Pt remains intubated and minimally responsive.  RN requests therapies hold today.  [name]., OTR/L Acute Rehabilitation Services Pager [phone] Office [phone]   [name] 10/10/2020, 12:32 PM
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
## 2020-10-10 NOTE — Progress Notes (Signed)
ANTICOAGULATION & ANTIBIOTIC CONSULT NOTE  Pharmacy Consult:  Heparin + Vancomycin/Cefepime Indication: CVA and Afib  + Sepsis/bacteremia  Allergies  Allergen Reactions  . Doxycycline Monohydrate Itching    *was taking two antibiotic at same time   . Sulfamethoxazole-Trimethoprim Itching    *was taking antibiotics at same time.  Marland Kitchen Penicillin G Rash    Patient Measurements: Height: 5' 8.25" (173.4 cm) Weight: 78.6 kg (173 lb 4.5 oz) IBW/kg (Calculated) : 68.98  Heparin Dosing Weight: 74 kg  Vital Signs: Temp: 99.6 F (37.6 C) (05/26 0400) Temp Source: Axillary (05/26 0400) BP: 126/65 (05/26 0700) Pulse Rate: 94 (05/26 0700)  Labs: Recent Labs    10/08/20 1303 10/08/20 1447 10/09/20 0423 10/09/20 1432 10/10/20 0002 10/10/20 0427  HGB 11.5* 10.5* 9.4*  --   --  7.2*  HCT 36.3* 31.0* 29.0*  --   --  21.8*  PLT 198  --  192  --   --  188  HEPARINUNFRC  --   --  0.72* 0.82* 0.38 0.46  CREATININE  --   --  4.32* 5.26*  --  5.42*    Estimated Creatinine Clearance: 11.5 mL/min (A) (by C-G formula based on SCr of 5.42 mg/dL (H)).    Assessment: 29 YOM with CVA likely due to Afib. He also developed a new, acute infarct on 5/24 CT.  Pharmacy consults to dose heparin.  Heparin level is therapeutic; no bleeding reported although H/H dropped significantly.  No bleeding observed per RN.  Pharmacy also consulted to dose vancomycin and cefepime since patient deteriorated on 5/24 PM.  Blood culture growing Staph epi.  Patient has an AKI and SCr continues to rise, at 5.42 today (BL SCr 0.9).  Random vancomycin level is at goal at 20 mcg/mL this AM.  Dosing vancomycin per level due to unstable renal function.  Afebrile, WBC down to 23.  Vanc 5/24 >> Cefepime 5/24 >>  5/26 VR = 20 mcg/mL (31 hr level) >> redose 1gm this PM  5/18 MRSA PCR - negative 5/24 UCx - negative 5/24 BCx -2/4 Staph epi  Goal of Therapy:  Heparin level 0.3-0.5 units/ml Monitor platelets by  anticoagulation protocol: Yes  Vanc level 15-20 mcg/mL   Plan:  Continue heparin infusion at 950 units/hr Daily heparin level and CBC  Vanc 1g IV this evening - dose per level Continue cefepime 1gm IV Q24H Monitor renal fxn, micro data, clinical progress, abx de-escalation  Teyah Rossy D. Mina Marble, PharmD, BCPS, [place] 10/10/2020, 12:51 PM

## 2020-10-10 NOTE — Progress Notes (Signed)
Inpatient Diabetes Program Recommendations  AACE/ADA: New Consensus Statement on Inpatient Glycemic Control (2015)  Target Ranges:  Prepandial:   less than 140 mg/dL      Peak postprandial:   less than 180 mg/dL (1-2 hours)      Critically ill patients:  140 - 180 mg/dL   Lab Results  Component Value Date   GLUCAP 279 (H) 10/10/2020   HGBA1C 5.3 10/02/2020    Review of Glycemic Control Results for JAYSHON, DOMMER (MRN [id number]) as of 10/10/2020 09:56  Ref. Range 10/09/2020 16:32 10/09/2020 19:58 10/09/2020 23:46 10/10/2020 03:09 10/10/2020 08:12  Glucose-Capillary Latest Ref Range: 70 - 99 mg/dL 171 (H) 242 (H) 159 (H) 200 (H) 279 (H)   Diabetes history: No DM history noted  Current orders for Inpatient glycemic control:  Solucortef 50 mg Q6H Osmolite 1.2 @ 60 ml/hr  Inpatient Diabetes Program Recommendations:     CBG's elevated with steroids and tube feeds.  Please consider:  Glycemic control order set Novolog 0-9 units Q4H.  Will continue to follow while inpatient.  Thank you, Reche Dixon, RN, BSN Diabetes Coordinator Inpatient Diabetes Program [phone] (team pager from 8a-5p)

## 2020-10-10 NOTE — Progress Notes (Signed)
PT Cancellation Note  Patient Details Name: SAHIL MILNER MRN: [id number] DOB: 07/29/1945   Cancelled Treatment:    Reason Eval/Treat Not Completed: Patient not medically ready;Medical issues which prohibited therapy;Patient's level of consciousness. Pt remains minimally responsive and intubated. RN requesting hold on PT treatment this date. Will follow-up another day as appropriate.   Moishe Spice, PT, DPT Acute Rehabilitation Services  Pager: [phone] Office: [place] 10/10/2020, 1:05 PM

## 2020-10-10 NOTE — Progress Notes (Signed)
EEG completed, results pending. 

## 2020-10-10 NOTE — Procedures (Signed)
Patient Name: Samuel Barry  MRN: [id number]  Epilepsy Attending: Lora Havens  Referring Physician/Provider: Dr. Antony Contras Date: 10/10/2020 Duration:  22.28 mins  Patient history: 75 year old male with left MCA infarct and altered mental status. EEG to evaluate for seizures.  Level of alertness: lethargic   AEDs during EEG study: None  Technical aspects: This EEG study was done with scalp electrodes positioned according to the 10-20 International system of electrode placement. Electrical activity was acquired at a sampling rate of 500Hz  and reviewed with a high frequency filter of 70Hz  and a low frequency filter of 1Hz . EEG data were recorded continuously and digitally stored.   Description: EEG showed generalized periodic discharges with triphasic morphology at 1 to 1.5 Hz as well as continuous generalized 3 to 5 Hz theta-delta slowing. Brief 1 to 2-second periods of generalized maximal frontocentral 15 to 18 Hz beta activity was also noted at times. Hyperventilation and photic stimulation were not performed.     ABNORMALITY - Periodic discharges with triphasic morphology, generalized ( GPDs) - Continuous slow, generalized  IMPRESSION: This study showed generalized periodic discharges with triphasic morphology at 1 to 1.5 Hz which is on the ictal-interictal continuum but can also be seen with toxic-metabolic causes.  Additionally there is evidence of moderate to severe diffuse encephalopathy, nonspecific etiology.  No seizures or definite epileptiform discharges were seen throughout the recording.  Krystina Strieter Barbra Sarks

## 2020-10-10 NOTE — Progress Notes (Signed)
ANTICOAGULATION CONSULT NOTE  Pharmacy Consult:  Heparin Indication: CVA and Afib  Allergies  Allergen Reactions  . Doxycycline Monohydrate Itching    *was taking two antibiotic at same time   . Sulfamethoxazole-Trimethoprim Itching    *was taking antibiotics at same time.  Marland Kitchen Penicillin G Rash    Patient Measurements: Height: 5' 8.25" (173.4 cm) Weight: 74.8 kg (164 lb 14.5 oz) IBW/kg (Calculated) : 68.98  Heparin Dosing Weight: 74 kg  Vital Signs: Temp: 100.2 F (37.9 C) (05/26 0000) Temp Source: Axillary (05/26 0000) BP: 109/69 (05/26 0023) Pulse Rate: 86 (05/26 0023)  Labs: Recent Labs    10/08/20 0343 10/08/20 0601 10/08/20 1303 10/08/20 1447 10/09/20 0423 10/09/20 1432 10/10/20 0002  HGB 15.2  --  11.5* 10.5* 9.4*  --   --   HCT 46.1  --  36.3* 31.0* 29.0*  --   --   PLT 211  --  198  --  192  --   --   HEPARINUNFRC 0.30  --   --   --  0.72* 0.82* 0.38  CREATININE  --  1.36*  --   --  4.32* 5.26*  --     Estimated Creatinine Clearance: 11.8 mL/min (A) (by C-G formula based on SCr of 5.26 mg/dL (H)).    Assessment: 75 yo male with CVA likely due to Afib.  He also developed a new, acute infarct on 5/24 CT.  Pharmacy consults to dose heparin.  Heparin level therapeutic (0.38) on gtt at 950 units/hr. No bleeding noted.  Goal of Therapy:  Heparin level 0.3-0.5 units/ml Monitor platelets by anticoagulation protocol: Yes   Plan:  Continue heparin gtt at 950 units/hr F/u daily heparin level and CBC  Sherlon Handing, PharmD, BCPS Please see amion for complete clinical pharmacist phone list 10/10/2020, 12:59 AM
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
## 2020-10-11 ENCOUNTER — Inpatient Hospital Stay (HOSPITAL_COMMUNITY): Payer: Medicare HMO

## 2020-10-11 DIAGNOSIS — R7881 Bacteremia: Secondary | ICD-10-CM | POA: Diagnosis not present

## 2020-10-11 DIAGNOSIS — G9340 Encephalopathy, unspecified: Secondary | ICD-10-CM | POA: Diagnosis not present

## 2020-10-11 DIAGNOSIS — R6521 Severe sepsis with septic shock: Secondary | ICD-10-CM

## 2020-10-11 DIAGNOSIS — J9601 Acute respiratory failure with hypoxia: Secondary | ICD-10-CM | POA: Diagnosis not present

## 2020-10-11 DIAGNOSIS — N179 Acute kidney failure, unspecified: Secondary | ICD-10-CM | POA: Diagnosis not present

## 2020-10-11 DIAGNOSIS — I480 Paroxysmal atrial fibrillation: Secondary | ICD-10-CM | POA: Diagnosis not present

## 2020-10-11 DIAGNOSIS — I6300 Cerebral infarction due to thrombosis of unspecified precerebral artery: Secondary | ICD-10-CM | POA: Diagnosis not present

## 2020-10-11 DIAGNOSIS — B9562 Methicillin resistant Staphylococcus aureus infection as the cause of diseases classified elsewhere: Secondary | ICD-10-CM

## 2020-10-11 DIAGNOSIS — R069 Unspecified abnormalities of breathing: Secondary | ICD-10-CM

## 2020-10-11 DIAGNOSIS — I63532 Cerebral infarction due to unspecified occlusion or stenosis of left posterior cerebral artery: Secondary | ICD-10-CM | POA: Diagnosis not present

## 2020-10-11 DIAGNOSIS — G9341 Metabolic encephalopathy: Secondary | ICD-10-CM | POA: Diagnosis not present

## 2020-10-11 DIAGNOSIS — A419 Sepsis, unspecified organism: Secondary | ICD-10-CM

## 2020-10-11 LAB — CULTURE, BLOOD (ROUTINE X 2): Special Requests: ADEQUATE

## 2020-10-11 LAB — COMPREHENSIVE METABOLIC PANEL
ALT: 438 U/L — ABNORMAL HIGH (ref 0–44)
AST: 190 U/L — ABNORMAL HIGH (ref 15–41)
Albumin: 1.8 g/dL — ABNORMAL LOW (ref 3.5–5.0)
Alkaline Phosphatase: 88 U/L (ref 38–126)
Anion gap: 11 (ref 5–15)
BUN: 132 mg/dL — ABNORMAL HIGH (ref 8–23)
CO2: 23 mmol/L (ref 22–32)
Calcium: 6.8 mg/dL — ABNORMAL LOW (ref 8.9–10.3)
Chloride: 105 mmol/L (ref 98–111)
Creatinine, Ser: 5.68 mg/dL — ABNORMAL HIGH (ref 0.61–1.24)
GFR, Estimated: 10 mL/min — ABNORMAL LOW (ref >60)
Glucose, Bld: 210 mg/dL — ABNORMAL HIGH (ref 70–99)
Potassium: 4.9 mmol/L (ref 3.5–5.1)
Sodium: 139 mmol/L (ref 135–145)
Total Bilirubin: 1 mg/dL (ref 0.3–1.2)
Total Protein: 5 g/dL — ABNORMAL LOW (ref 6.5–8.1)

## 2020-10-11 LAB — GLUCOSE, CAPILLARY
Glucose-Capillary: 168 mg/dL — ABNORMAL HIGH (ref 70–99)
Glucose-Capillary: 179 mg/dL — ABNORMAL HIGH (ref 70–99)
Glucose-Capillary: 190 mg/dL — ABNORMAL HIGH (ref 70–99)
Glucose-Capillary: 194 mg/dL — ABNORMAL HIGH (ref 70–99)
Glucose-Capillary: 212 mg/dL — ABNORMAL HIGH (ref 70–99)
Glucose-Capillary: 253 mg/dL — ABNORMAL HIGH (ref 70–99)

## 2020-10-11 LAB — HEPARIN LEVEL (UNFRACTIONATED)
Heparin Unfractionated: 0.22 IU/mL — ABNORMAL LOW (ref 0.30–0.70)
Heparin Unfractionated: 0.15 IU/mL — ABNORMAL LOW (ref 0.30–0.70)

## 2020-10-11 LAB — CBC
HCT: 18.9 % — ABNORMAL LOW (ref 39.0–52.0)
HCT: 24.2 % — ABNORMAL LOW (ref 39.0–52.0)
Hemoglobin: 6.1 g/dL — CL (ref 13.0–17.0)
Hemoglobin: 8.3 g/dL — ABNORMAL LOW (ref 13.0–17.0)
MCH: 29 pg (ref 26.0–34.0)
MCH: 30.2 pg (ref 26.0–34.0)
MCHC: 32.3 g/dL (ref 30.0–36.0)
MCHC: 34.3 g/dL (ref 30.0–36.0)
MCV: 90 fL (ref 80.0–100.0)
MCV: 88 fL (ref 80.0–100.0)
Platelets: 209 10*3/uL (ref 150–400)
Platelets: 191 10*3/uL (ref 150–400)
RBC: 2.1 MIL/uL — ABNORMAL LOW (ref 4.22–5.81)
RBC: 2.75 MIL/uL — ABNORMAL LOW (ref 4.22–5.81)
RDW: 13.7 % (ref 11.5–15.5)
RDW: 14.1 % (ref 11.5–15.5)
WBC: 23.1 10*3/uL — ABNORMAL HIGH (ref 4.0–10.5)
WBC: 22.8 10*3/uL — ABNORMAL HIGH (ref 4.0–10.5)
nRBC: 0.3 % — ABNORMAL HIGH (ref 0.0–0.2)
nRBC: 0.3 % — ABNORMAL HIGH (ref 0.0–0.2)

## 2020-10-11 LAB — HEMOGLOBIN AND HEMATOCRIT, BLOOD
HCT: 24.3 % — ABNORMAL LOW (ref 39.0–52.0)
Hemoglobin: 8.3 g/dL — ABNORMAL LOW (ref 13.0–17.0)

## 2020-10-11 LAB — PREPARE RBC (CROSSMATCH)

## 2020-10-11 LAB — ABO/RH: ABO/RH(D): B POS

## 2020-10-11 MED ORDER — SODIUM CHLORIDE 0.9% IV SOLUTION: Freq: Once | INTRAVENOUS | Status: DC

## 2020-10-11 MED ORDER — HEPARIN (PORCINE) 25000 UT/250ML-% IV SOLN
1750.0000 [IU]/h | INTRAVENOUS | Status: DC
  Administered 2020-10-11: 950 [IU]/h via INTRAVENOUS
  Administered 2020-10-12: 1150 [IU]/h via INTRAVENOUS
  Administered 2020-10-13: 1250 [IU]/h via INTRAVENOUS
  Administered 2020-10-14 – 2020-10-16 (×4): 1300 [IU]/h via INTRAVENOUS
  Administered 2020-10-17: 1350 [IU]/h via INTRAVENOUS
  Administered 2020-10-18: 1400 [IU]/h via INTRAVENOUS
  Administered 2020-10-19 (×2): 1500 [IU]/h via INTRAVENOUS
  Administered 2020-10-20: 1750 [IU]/h via INTRAVENOUS
  Filled 2020-10-11 (×14): qty 250

## 2020-10-11 MED ORDER — DEXMEDETOMIDINE HCL IN NACL 400 MCG/100ML IV SOLN
0.0000 ug/kg/h | INTRAVENOUS | Status: AC
  Administered 2020-10-11 (×2): 0.5 ug/kg/h via INTRAVENOUS
  Administered 2020-10-12: 0.6 ug/kg/h via INTRAVENOUS
  Administered 2020-10-12: 0.5 ug/kg/h via INTRAVENOUS
  Administered 2020-10-13: 0.7 ug/kg/h via INTRAVENOUS
  Administered 2020-10-13 (×2): 0.5 ug/kg/h via INTRAVENOUS
  Administered 2020-10-14: 0.6 ug/kg/h via INTRAVENOUS
  Administered 2020-10-14: 0.7 ug/kg/h via INTRAVENOUS
  Filled 2020-10-11 (×8): qty 100

## 2020-10-11 MED ORDER — CEFAZOLIN SODIUM-DEXTROSE 1-4 GM/50ML-% IV SOLN
1.0000 g | Freq: Two times a day (BID) | INTRAVENOUS | Status: AC
  Administered 2020-10-11 – 2020-10-18 (×15): 1 g via INTRAVENOUS
  Filled 2020-10-11 (×16): qty 50

## 2020-10-11 MED ORDER — INSULIN ASPART 100 UNIT/ML IJ SOLN
2.0000 [IU] | INTRAMUSCULAR | Status: DC
  Administered 2020-10-11 – 2020-10-15 (×23): 2 [IU] via SUBCUTANEOUS

## 2020-10-11 NOTE — Progress Notes (Signed)
Date and time results received: 10/11/20 0646  Test: Hemoglobin   Critical Value: 6.1  Name of Provider Notified: Elink RN - Ivin Booty   Orders Received? Or Actions Taken?: Awaiting further orders

## 2020-10-11 NOTE — Progress Notes (Signed)
PT Cancellation Note  Patient Details Name: Samuel Barry MRN: [id number] DOB: September 14, 1945   Cancelled Treatment:    Reason Eval/Treat Not Completed: Patient not medically ready;Medical issues which prohibited therapy;Patient's level of consciousness. Pt remains minimally responsive and intubated. RN requesting hold on PT treatment this date. Will follow-up another day as appropriate.   Moishe Spice, PT, DPT Acute Rehabilitation Services  Pager: [phone] Office: [place] 10/11/2020, 8:38 AM

## 2020-10-11 NOTE — Care Plan (Signed)
  Family Goals of Care Meeting:   Met with Pt's daughter Samuel Barry, son Samuel Barry via phone and Dr. Leonie Man via phone.  We discussed that Samuel Barry's clinical status has worsened in the last few days as he is now requiring ventilator support after significant strokes, facing a kidney injury, bacteremia and requiring pressors.    Samuel Barry and Samuel Barry expressed that their father is an extremely independent person and would never want to live long term on life support and would not want a trach/peg.    We discussed a plan of continuing the current plan of care for several more days as his pressor requirement came down today, he has made >1L urine and is more awake and tracking with his eyes this afternoon.  His family affirms that if his kidney function were to worsen, that they would definitely not want hemodialysis.    [name], PA-C [facility] See Amion for pager If no response to pager , please call [phone] until 7pm After 7:00 pm call Elink  [phone]   Additional critical care time 35 minutes

## 2020-10-11 NOTE — Progress Notes (Signed)
ANTICOAGULATION CONSULT NOTE  Pharmacy Consult:  Heparin Indication: CVA and Afib  Labs: Recent Labs    10/09/20 1432 10/10/20 0002 10/10/20 0427 10/11/20 0524 10/11/20 1711 10/11/20 2125  HGB  --   --  7.2* 6.1* 8.3* 8.3*  HCT  --   --  21.8* 18.9* 24.3* 24.2*  PLT  --   --  188 209  --  191  HEPARINUNFRC 0.82*   < > 0.46 0.22*  --  0.15*  CREATININE 5.26*  --  5.42* 5.68*  --   --    < > = values in this interval not displayed.    Assessment: 75 yo male with CVA likely due to Afib.  He also developed a new, acute infarct on 5/24 CT.  Pharmacy consulted to dose heparin.  Heparin held this AM due to anemia requiring transfusion.  Okay to resume IV heparin per CCM.  Heparin level 0.15 units/ml.  No bleeding reported  Goal of Therapy:  Heparin level 0.3-0.5 units/ml Monitor platelets by anticoagulation protocol: Yes   Plan:  Increase heparin gtt to 1050 units/hr Check 8 hr heparin level Daily heparin level and CBC Monitor closely for bleeding  Thanks for allowing pharmacy to be a part of this patient's care.  Excell Seltzer, PharmD Clinical Pharmacist  10/11/2020, 11:32 PM
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
## 2020-10-11 NOTE — Progress Notes (Signed)
vLTM EEG started.Notified Neuro.

## 2020-10-11 NOTE — Progress Notes (Signed)
ANTICOAGULATION CONSULT NOTE  Pharmacy Consult:  Heparin Indication: CVA and Afib  Allergies  Allergen Reactions  . Doxycycline Monohydrate Itching    *was taking two antibiotic at same time   . Sulfamethoxazole-Trimethoprim Itching    *was taking antibiotics at same time.  Marland Kitchen Penicillin G Rash    Patient Measurements: Height: 5' 8.25" (173.4 cm) Weight: 81.6 kg (179 lb 14.3 oz) IBW/kg (Calculated) : 68.98  Heparin Dosing Weight: 74 kg  Vital Signs: Temp: 98.3 F (36.8 C) (05/27 1200) Temp Source: Axillary (05/27 1200) BP: 124/64 (05/27 1200) Pulse Rate: 89 (05/27 1200)  Labs: Recent Labs    10/09/20 0423 10/09/20 1432 10/10/20 0002 10/10/20 0427 10/11/20 0524  HGB 9.4*  --   --  7.2* 6.1*  HCT 29.0*  --   --  21.8* 18.9*  PLT 192  --   --  188 209  HEPARINUNFRC 0.72* 0.82* 0.38 0.46 0.22*  CREATININE 4.32* 5.26*  --  5.42* 5.68*    Estimated Creatinine Clearance: 11 mL/min (A) (by C-G formula based on SCr of 5.68 mg/dL (H)).   Assessment: 75 yo male with CVA likely due to Afib.  He also developed a new, acute infarct on 5/24 CT.  Pharmacy consulted to dose heparin.  Heparin held this AM due to anemia requiring transfusion.  Okay to resume IV heparin per CCM.  Goal of Therapy:  Heparin level 0.3-0.5 units/ml Monitor platelets by anticoagulation protocol: Yes   Plan:  Resume heparin gtt at 950 units/hr Check 8 hr heparin level Daily heparin level and CBC Monitor closely for bleeding  Jilliana Burkes D. Mina Marble, PharmD, BCPS, [place] 10/11/2020, 12:50 PM

## 2020-10-11 NOTE — Progress Notes (Signed)
eLink Physician-Brief Progress Note Patient Name: Samuel Barry DOB: 1945-08-25 MRN: [id number]   Date of Service  10/11/2020  HPI/Events of Note  Patient with a drop in hemoglobin from 11.5 gm to 7.2 gm, repeat hemoglobin is 6.1 gm.  eICU Interventions  Heparin infusion discontinued, 2 units PRBC ordered transfused, CCM [employer] requested to sign patient out to the daytime PCCM attending physician covering [place].        Kerry Kass Annika Selke 10/11/2020, 7:09 AM

## 2020-10-11 NOTE — Progress Notes (Signed)
ANTICOAGULATION CONSULT NOTE  Pharmacy Consult:  Heparin Indication: CVA and Afib  Allergies  Allergen Reactions  . Doxycycline Monohydrate Itching    *was taking two antibiotic at same time   . Sulfamethoxazole-Trimethoprim Itching    *was taking antibiotics at same time.  Marland Kitchen Penicillin G Rash    Patient Measurements: Height: 5' 8.25" (173.4 cm) Weight: 81.6 kg (179 lb 14.3 oz) IBW/kg (Calculated) : 68.98  Heparin Dosing Weight: 74 kg  Vital Signs: Temp: 100 F (37.8 C) (05/27 0400) Temp Source: Oral (05/27 0400) BP: 97/66 (05/27 0600) Pulse Rate: 83 (05/27 0600)  Labs: Recent Labs    10/08/20 1303 10/08/20 1447 10/08/20 1447 10/09/20 0423 10/09/20 1432 10/10/20 0002 10/10/20 0427 10/11/20 0524  HGB 11.5* 10.5*  --  9.4*  --   --  7.2*  --   HCT 36.3* 31.0*  --  29.0*  --   --  21.8*  --   PLT 198  --   --  192  --   --  188  --   HEPARINUNFRC  --   --    < > 0.72* 0.82* 0.38 0.46 0.22*  CREATININE  --   --   --  4.32* 5.26*  --  5.42*  --    < > = values in this interval not displayed.    Estimated Creatinine Clearance: 11.5 mL/min (A) (by C-G formula based on SCr of 5.42 mg/dL (H)).   Assessment: 75 yo male with CVA likely due to Afib.  He also developed a new, acute infarct on 5/24 CT.  Pharmacy consulted to dose heparin.  Heparin level down to subtherapeutic (0.22) on gtt at 950 units/hr. No issues with line or bleeding reported per RN.  Goal of Therapy:  Heparin level 0.3-0.5 units/ml Monitor platelets by anticoagulation protocol: Yes   Plan:  Continue heparin gtt at 950 units/hr F/u 8 hr heparin level  Sherlon Handing, PharmD, BCPS Please see amion for complete clinical pharmacist phone list 10/11/2020, 6:33 AM
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
## 2020-10-12 DIAGNOSIS — R7881 Bacteremia: Secondary | ICD-10-CM | POA: Diagnosis not present

## 2020-10-12 DIAGNOSIS — G9340 Encephalopathy, unspecified: Secondary | ICD-10-CM | POA: Diagnosis not present

## 2020-10-12 DIAGNOSIS — Z66 Do not resuscitate: Secondary | ICD-10-CM

## 2020-10-12 DIAGNOSIS — I63433 Cerebral infarction due to embolism of bilateral posterior cerebral arteries: Secondary | ICD-10-CM | POA: Diagnosis not present

## 2020-10-12 DIAGNOSIS — I6300 Cerebral infarction due to thrombosis of unspecified precerebral artery: Secondary | ICD-10-CM | POA: Diagnosis not present

## 2020-10-12 DIAGNOSIS — N179 Acute kidney failure, unspecified: Secondary | ICD-10-CM | POA: Diagnosis not present

## 2020-10-12 DIAGNOSIS — E0591 Thyrotoxicosis, unspecified with thyrotoxic crisis or storm: Secondary | ICD-10-CM | POA: Diagnosis not present

## 2020-10-12 DIAGNOSIS — Z7189 Other specified counseling: Secondary | ICD-10-CM

## 2020-10-12 LAB — CBC
HCT: 23.6 % — ABNORMAL LOW (ref 39.0–52.0)
Hemoglobin: 7.9 g/dL — ABNORMAL LOW (ref 13.0–17.0)
MCH: 29.7 pg (ref 26.0–34.0)
MCHC: 33.5 g/dL (ref 30.0–36.0)
MCV: 88.7 fL (ref 80.0–100.0)
Platelets: 186 10*3/uL (ref 150–400)
RBC: 2.66 MIL/uL — ABNORMAL LOW (ref 4.22–5.81)
RDW: 14.3 % (ref 11.5–15.5)
WBC: 21.9 10*3/uL — ABNORMAL HIGH (ref 4.0–10.5)
nRBC: 0.2 % (ref 0.0–0.2)

## 2020-10-12 LAB — TYPE AND SCREEN
ABO/RH(D): B POS
Antibody Screen: NEGATIVE
Unit division: 0
Unit division: 0

## 2020-10-12 LAB — BASIC METABOLIC PANEL
Anion gap: 15 (ref 5–15)
BUN: 140 mg/dL — ABNORMAL HIGH (ref 8–23)
CO2: 24 mmol/L (ref 22–32)
Calcium: 7.3 mg/dL — ABNORMAL LOW (ref 8.9–10.3)
Chloride: 104 mmol/L (ref 98–111)
Creatinine, Ser: 4.87 mg/dL — ABNORMAL HIGH (ref 0.61–1.24)
GFR, Estimated: 12 mL/min — ABNORMAL LOW (ref >60)
Glucose, Bld: 200 mg/dL — ABNORMAL HIGH (ref 70–99)
Potassium: 4.9 mmol/L (ref 3.5–5.1)
Sodium: 143 mmol/L (ref 135–145)

## 2020-10-12 LAB — BPAM RBC
Blood Product Expiration Date: 202206192359
Blood Product Expiration Date: 202206192359
ISSUE DATE / TIME: 202205270911
ISSUE DATE / TIME: 202205271256
Unit Type and Rh: 7300
Unit Type and Rh: 7300

## 2020-10-12 LAB — GLUCOSE, CAPILLARY
Glucose-Capillary: 193 mg/dL — ABNORMAL HIGH (ref 70–99)
Glucose-Capillary: 184 mg/dL — ABNORMAL HIGH (ref 70–99)
Glucose-Capillary: 135 mg/dL — ABNORMAL HIGH (ref 70–99)
Glucose-Capillary: 165 mg/dL — ABNORMAL HIGH (ref 70–99)
Glucose-Capillary: 163 mg/dL — ABNORMAL HIGH (ref 70–99)
Glucose-Capillary: 174 mg/dL — ABNORMAL HIGH (ref 70–99)

## 2020-10-12 LAB — HEPARIN LEVEL (UNFRACTIONATED)
Heparin Unfractionated: 0.24 IU/mL — ABNORMAL LOW (ref 0.30–0.70)
Heparin Unfractionated: 0.18 IU/mL — ABNORMAL LOW (ref 0.30–0.70)

## 2020-10-12 MED ORDER — AMIODARONE IV BOLUS ONLY 150 MG/100ML: 150.0000 mg | Freq: Once | INTRAVENOUS | Status: DC

## 2020-10-12 MED ORDER — METOPROLOL TARTRATE 5 MG/5ML IV SOLN
5.0000 mg | INTRAVENOUS | Status: DC | PRN
  Administered 2020-10-12 – 2020-10-15 (×2): 5 mg via INTRAVENOUS
  Filled 2020-10-12 (×3): qty 5

## 2020-10-12 MED ORDER — AMIODARONE HCL IN DEXTROSE 360-4.14 MG/200ML-% IV SOLN
INTRAVENOUS | Status: AC
  Filled 2020-10-12: qty 200
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
## 2020-10-12 NOTE — Progress Notes (Signed)
ANTICOAGULATION CONSULT NOTE  Pharmacy Consult:  Heparin Indication: CVA and Afib  Labs: Recent Labs    10/10/20 0427 10/11/20 0524 10/11/20 1711 10/11/20 2125 10/12/20 0514 10/12/20 0827 10/12/20 1839  HGB 7.2* 6.1* 8.3* 8.3* 7.9*  --   --   HCT 21.8* 18.9* 24.3* 24.2* 23.6*  --   --   PLT 188 209  --  191 186  --   --   HEPARINUNFRC 0.46 0.22*  --  0.15*  --  0.24* 0.18*  CREATININE 5.42* 5.68*  --   --  4.87*  --   --     Assessment: 75 yo male with CVA likely due to Afib.  He also developed a new, acute infarct on 5/24 CT.  Pharmacy consulted to dose heparin.  Heparin level 0.18 units/ml. 2 units transfused 5/27. No new bleeding events noted.  Goal of Therapy:  Heparin level 0.3-0.5 units/ml Monitor platelets by anticoagulation protocol: Yes   Plan:  Increase heparin gtt to 1250 units/hr Check 8 hr heparin level Daily heparin level and CBC Monitor closely for bleeding  Thanks for allowing pharmacy to be a part of this patient's care.  Lorelei Pont, PharmD, BCPS Emergency Medicine Clinical Pharmacist 10/12/2020 7:57 PM

## 2020-10-12 NOTE — Progress Notes (Signed)
ANTICOAGULATION CONSULT NOTE  Pharmacy Consult:  Heparin Indication: CVA and Afib  Labs: Recent Labs    10/10/20 0427 10/11/20 0524 10/11/20 1711 10/11/20 2125 10/12/20 0514 10/12/20 0827  HGB 7.2* 6.1* 8.3* 8.3* 7.9*  --   HCT 21.8* 18.9* 24.3* 24.2* 23.6*  --   PLT 188 209  --  191 186  --   HEPARINUNFRC 0.46 0.22*  --  0.15*  --  0.24*  CREATININE 5.42* 5.68*  --   --  4.87*  --     Assessment: 75 yo male with CVA likely due to Afib.  He also developed a new, acute infarct on 5/24 CT.  Pharmacy consulted to dose heparin.  Heparin level 0.24 units/ml.  No bleeding reported  Goal of Therapy:  Heparin level 0.3-0.5 units/ml Monitor platelets by anticoagulation protocol: Yes   Plan:  Increase heparin gtt to 1150 units/hr Check 8 hr heparin level Daily heparin level and CBC Monitor closely for bleeding  Thanks for allowing pharmacy to be a part of this patient's care.  Alanda Slim, PharmD, [facility] Clinical Pharmacist Please see AMION for all Pharmacists' Contact Phone Numbers 10/12/2020, 9:02 AM

## 2020-10-12 NOTE — Procedures (Addendum)
Patient Name: Samuel Barry  MRN: [id number]  Epilepsy Attending: Lora Havens  Referring Physician/Provider: Charlene Brooke, NP Duration:  10/11/2020 1025 to 10/12/2020 1025  Patient history: 75 year old male with left MCA infarct and altered mental status. EEG to evaluate for seizures.  Level of alertness: lethargic   AEDs during EEG study: None  Technical aspects: This EEG study was done with scalp electrodes positioned according to the 10-20 International system of electrode placement. Electrical activity was acquired at a sampling rate of 500Hz  and reviewed with a high frequency filter of 70Hz  and a low frequency filter of 1Hz . EEG data were recorded continuously and digitally stored.   Description: EEG showed generalized periodic discharges with triphasic morphology at 1 to 1.5 Hz as well as continuous generalized 3 to 5 Hz theta-delta slowing. Brief 1 to 2-second periods of generalized maximal frontocentral 15 to 18 Hz beta activity was also noted at times. Hyperventilation and photic stimulation were not performed.     ABNORMALITY - Periodic discharges with triphasic morphology, generalized ( GPDs) - Continuous slow, generalized  IMPRESSION: This study showed generalized periodic discharges with triphasic morphology at 1 to 1.5 Hz which is on the ictal-interictal continuum but given the morphology and frequency is more likely due to toxic-metabolic causes.  Additionally there is evidence of moderate to severe diffuse encephalopathy, nonspecific etiology.  No seizures or definite epileptiform discharges were seen throughout the recording.  Daysia Vandenboom Barbra Sarks
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
## 2020-10-13 DIAGNOSIS — N19 Unspecified kidney failure: Secondary | ICD-10-CM

## 2020-10-13 DIAGNOSIS — G9341 Metabolic encephalopathy: Secondary | ICD-10-CM | POA: Diagnosis not present

## 2020-10-13 DIAGNOSIS — I6300 Cerebral infarction due to thrombosis of unspecified precerebral artery: Secondary | ICD-10-CM | POA: Diagnosis not present

## 2020-10-13 DIAGNOSIS — I63433 Cerebral infarction due to embolism of bilateral posterior cerebral arteries: Secondary | ICD-10-CM | POA: Diagnosis not present

## 2020-10-13 DIAGNOSIS — J9601 Acute respiratory failure with hypoxia: Secondary | ICD-10-CM | POA: Diagnosis not present

## 2020-10-13 DIAGNOSIS — N179 Acute kidney failure, unspecified: Secondary | ICD-10-CM | POA: Diagnosis not present

## 2020-10-13 DIAGNOSIS — G9340 Encephalopathy, unspecified: Secondary | ICD-10-CM | POA: Diagnosis not present

## 2020-10-13 LAB — BASIC METABOLIC PANEL
Anion gap: 10 (ref 5–15)
BUN: 168 mg/dL — ABNORMAL HIGH (ref 8–23)
CO2: 26 mmol/L (ref 22–32)
Calcium: 8.2 mg/dL — ABNORMAL LOW (ref 8.9–10.3)
Chloride: 116 mmol/L — ABNORMAL HIGH (ref 98–111)
Creatinine, Ser: 5.1 mg/dL — ABNORMAL HIGH (ref 0.61–1.24)
GFR, Estimated: 11 mL/min — ABNORMAL LOW (ref >60)
Glucose, Bld: 164 mg/dL — ABNORMAL HIGH (ref 70–99)
Potassium: 5.1 mmol/L (ref 3.5–5.1)
Sodium: 152 mmol/L — ABNORMAL HIGH (ref 135–145)

## 2020-10-13 LAB — GLUCOSE, CAPILLARY
Glucose-Capillary: 129 mg/dL — ABNORMAL HIGH (ref 70–99)
Glucose-Capillary: 196 mg/dL — ABNORMAL HIGH (ref 70–99)
Glucose-Capillary: 168 mg/dL — ABNORMAL HIGH (ref 70–99)
Glucose-Capillary: 138 mg/dL — ABNORMAL HIGH (ref 70–99)
Glucose-Capillary: 162 mg/dL — ABNORMAL HIGH (ref 70–99)
Glucose-Capillary: 181 mg/dL — ABNORMAL HIGH (ref 70–99)

## 2020-10-13 LAB — CBC
HCT: 24.3 % — ABNORMAL LOW (ref 39.0–52.0)
Hemoglobin: 8 g/dL — ABNORMAL LOW (ref 13.0–17.0)
MCH: 30.2 pg (ref 26.0–34.0)
MCHC: 32.9 g/dL (ref 30.0–36.0)
MCV: 91.7 fL (ref 80.0–100.0)
Platelets: 208 10*3/uL (ref 150–400)
RBC: 2.65 MIL/uL — ABNORMAL LOW (ref 4.22–5.81)
RDW: 14.5 % (ref 11.5–15.5)
WBC: 24.3 10*3/uL — ABNORMAL HIGH (ref 4.0–10.5)
nRBC: 0.1 % (ref 0.0–0.2)

## 2020-10-13 LAB — HEPARIN LEVEL (UNFRACTIONATED)
Heparin Unfractionated: 0.36 IU/mL (ref 0.30–0.70)
Heparin Unfractionated: 0.29 IU/mL — ABNORMAL LOW (ref 0.30–0.70)

## 2020-10-13 MED ORDER — METOPROLOL TARTRATE 25 MG PO TABS
25.0000 mg | ORAL_TABLET | Freq: Two times a day (BID) | ORAL | Status: DC
  Administered 2020-10-13 – 2020-10-14 (×2): 25 mg
  Filled 2020-10-13 (×3): qty 1

## 2020-10-13 MED ORDER — FUROSEMIDE 10 MG/ML IJ SOLN
40.0000 mg | Freq: Once | INTRAMUSCULAR | Status: AC
  Administered 2020-10-13: 40 mg via INTRAVENOUS
  Filled 2020-10-13: qty 4

## 2020-10-13 NOTE — Progress Notes (Signed)
ANTICOAGULATION CONSULT NOTE - Follow Up Consult  Pharmacy Consult for heparin Indication: Afib/CVA  Labs: Recent Labs    10/11/20 0524 10/11/20 1711 10/11/20 2125 10/12/20 0514 10/12/20 0827 10/12/20 1839 10/13/20 0430  HGB 6.1*   < > 8.3* 7.9*  --   --  8.0*  HCT 18.9*   < > 24.2* 23.6*  --   --  24.3*  PLT 209  --  191 186  --   --  208  HEPARINUNFRC 0.22*  --  0.15*  --  0.24* 0.18* 0.36  CREATININE 5.68*  --   --  4.87*  --   --   --    < > = values in this interval not displayed.    Assessment/Plan:  75yo male therapeutic on heparin after rate changes. Will continue gtt at current rate of 1250 units/hr and confirm stable with additional level.   Wynona Neat, PharmD, BCPS  10/13/2020,5:22 AM

## 2020-10-13 NOTE — Progress Notes (Signed)
   10/13/20 0807  Airway 7.5 mm  Placement Date/Time: 10/08/20 1315   Placed By: (c) ICU physician  Airway Device: Endotracheal Tube  ETT Types: Oral  Size (mm): 7.5 mm  Cuffed: Cuffed  Insertion attempts: 1  Airway Equipment: Stylet;Video Laryngoscope  Placement Confirmation: Direct...  Secured at (cm) 25 cm  Measured From Lips  Secured Location Left  Secured By Actuary Repositioned Yes  Prone position No  Cuff Pressure (cm H2O) Green OR 18-26 CmH2O  Site Condition Cool  Adult Ventilator Settings  Vent Type Servo U  Humidity HME  Vent Mode CPAP;PSV  FiO2 (%) 40 %  Pressure Support 8 cmH20  PEEP 5 cmH20  Adult Ventilator Measurements  Peak Airway Pressure 13 L/min  Mean Airway Pressure 8 cmH20  Resp Rate Spontaneous 21 br/min  Resp Rate Total 21 br/min  Spont TV 564 mL  Measured Ve 12.2 mL  Auto PEEP 0 cmH20  Total PEEP 5 cmH20  SpO2 99 %  Adult Ventilator Alarms  Alarms On Y  Ve High Alarm 20 L/min  Ve Low Alarm 5 L/min  Resp Rate High Alarm 38 br/min  Resp Rate Low Alarm 10  PEEP Low Alarm 3 cmH2O  Press High Alarm 45 cmH2O  T Apnea 20 sec(s)  Daily Weaning Assessment  Daily Assessment of Readiness to Wean Wean protocol criteria met (SBT performed)  SBT Method CPAP 5 cm H20 and PS 5 cm H20 (8/5)  Weaning Start Time 0807  Patient response Passed (Tolerated well)  Breath Sounds  Bilateral Breath Sounds Clear;Diminished  Airway Suctioning/Secretions  Suction Type ETT  Suction Device  Catheter  Secretion Amount Copious  Secretion Color Coard  Secretion Consistency Thick  Suction Tolerance Tolerated poorly  Suctioning Adverse Effects Anxiety;Tachycardia

## 2020-10-13 NOTE — Progress Notes (Addendum)
ANTICOAGULATION CONSULT NOTE - Follow Up Consult  Pharmacy Consult for Heparin Indication: afib + CVA  Allergies  Allergen Reactions  . Doxycycline Monohydrate Itching    *was taking two antibiotic at same time   . Sulfamethoxazole-Trimethoprim Itching    *was taking antibiotics at same time.  Marland Kitchen Penicillin G Rash    Patient Measurements: Height: 5' 8.25" (173.4 cm) Weight: 82.2 kg (181 lb 3.5 oz) IBW/kg (Calculated) : 68.98 Heparin Dosing Weight:  82.2 kg  Vital Signs: Temp: 98.3 F (36.8 C) (05/29 1200) Temp Source: Oral (05/29 1200) BP: 104/66 (05/29 1400) Pulse Rate: 85 (05/29 1400)  Labs: Recent Labs    10/11/20 0524 10/11/20 1711 10/11/20 2125 10/12/20 0514 10/12/20 0827 10/12/20 1839 10/13/20 0430 10/13/20 1258  HGB 6.1*   < > 8.3* 7.9*  --   --  8.0*  --   HCT 18.9*   < > 24.2* 23.6*  --   --  24.3*  --   PLT 209  --  191 186  --   --  208  --   HEPARINUNFRC 0.22*  --  0.15*  --    < > 0.18* 0.36 0.29*  CREATININE 5.68*  --   --  4.87*  --   --   --   --    < > = values in this interval not displayed.    Estimated Creatinine Clearance: 12.8 mL/min (A) (by C-G formula based on SCr of 4.87 mg/dL (H)).   Assessment: Anticoag: Heparin for new Afib, new CVA x2 -?blood per rectum. Hgb 8 stable. Plts 208 WNL. Heparin level 0.29 slightly < goal.  Goal of Therapy:  Heparin level 0.3-0.5 units/ml Monitor platelets by anticoagulation protocol: Yes   Plan:  Increase heparin slightly to 1300 units/hr Daily HL and CBC Ok with Dr. Shearon Stalls to hold statin until LFT's normalize.   Aysia Lowder S. [place], PharmD, BCPS Clinical Staff Pharmacist [email or web address] Alford Highland, [employer] 10/13/2020,2:38 PM

## 2020-10-13 NOTE — Progress Notes (Signed)
EEG discontinue, No skin break seen.
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
## 2020-10-13 NOTE — Procedures (Addendum)
Patient Name:Samuel Barry OVZ:[id number] Epilepsy Attending:Sherill Wegener Barbra Sarks Referring Physician/Provider:Delila Glenna Fellows, NP Duration:10/12/2020 1025 to 10/13/2020 1051  Patient history:75 year old male with left MCA infarct and altered mental status. EEGtoevaluate for seizures.  Level of alertness:lethargic , asleep  AEDs during EEG study:None  Technical aspects: This EEG study was done with scalp electrodes positioned according to the 10-20 International system of electrode placement. Electrical activity was acquired at a sampling rate of 500Hz  and reviewed with a high frequency filter of 70Hz  and a low frequency filter of 1Hz . EEG data were recorded continuously and digitally stored.   Description:No posterior dominant rhythm was seen. Sleep was characterized by sleep spindles (12-14hz ), maximal frontocentral region. EEG showed continuous generalized and maximal bifrontal 3 to 5 Hz theta-delta slowing. When patient is awake/stimulated, eeg showed generalized periodic discharges with triphasic morphology at 1 to 1.5 Hz. Spikes were noted in right parieto-occipital region. Hyperventilation and photic stimulation were not performed.   ABNORMALITY - Spikes, right parieto-occipital region. - Continuous slow, generalized and maximal bifrontal - Periodic discharges with triphasic morphology, generalized ( GPDs)  IMPRESSION: This studyshowed evidence of potential epileptogenicity arising from right parieto-occipital region.Additionally there is evidence of moderate to severe diffuse encephalopathy, nonspecific etiology. No seizures were seen throughout the recording.   [name] Barbra Sarks

## 2020-10-14 DIAGNOSIS — I63433 Cerebral infarction due to embolism of bilateral posterior cerebral arteries: Secondary | ICD-10-CM | POA: Diagnosis not present

## 2020-10-14 DIAGNOSIS — I6300 Cerebral infarction due to thrombosis of unspecified precerebral artery: Secondary | ICD-10-CM | POA: Diagnosis not present

## 2020-10-14 DIAGNOSIS — L8990 Pressure ulcer of unspecified site, unspecified stage: Secondary | ICD-10-CM | POA: Diagnosis not present

## 2020-10-14 DIAGNOSIS — N170 Acute kidney failure with tubular necrosis: Secondary | ICD-10-CM | POA: Diagnosis not present

## 2020-10-14 LAB — BASIC METABOLIC PANEL
Anion gap: 9 (ref 5–15)
BUN: 191 mg/dL — ABNORMAL HIGH (ref 8–23)
CO2: 27 mmol/L (ref 22–32)
Calcium: 8.3 mg/dL — ABNORMAL LOW (ref 8.9–10.3)
Chloride: 115 mmol/L — ABNORMAL HIGH (ref 98–111)
Creatinine, Ser: 5.16 mg/dL — ABNORMAL HIGH (ref 0.61–1.24)
GFR, Estimated: 11 mL/min — ABNORMAL LOW (ref >60)
Glucose, Bld: 205 mg/dL — ABNORMAL HIGH (ref 70–99)
Potassium: 5.2 mmol/L — ABNORMAL HIGH (ref 3.5–5.1)
Sodium: 151 mmol/L — ABNORMAL HIGH (ref 135–145)

## 2020-10-14 LAB — CBC
HCT: 25.9 % — ABNORMAL LOW (ref 39.0–52.0)
Hemoglobin: 8.3 g/dL — ABNORMAL LOW (ref 13.0–17.0)
MCH: 30.3 pg (ref 26.0–34.0)
MCHC: 32 g/dL (ref 30.0–36.0)
MCV: 94.5 fL (ref 80.0–100.0)
Platelets: 252 10*3/uL (ref 150–400)
RBC: 2.74 MIL/uL — ABNORMAL LOW (ref 4.22–5.81)
RDW: 14.6 % (ref 11.5–15.5)
WBC: 21 10*3/uL — ABNORMAL HIGH (ref 4.0–10.5)
nRBC: 0 % (ref 0.0–0.2)

## 2020-10-14 LAB — GLUCOSE, CAPILLARY
Glucose-Capillary: 162 mg/dL — ABNORMAL HIGH (ref 70–99)
Glucose-Capillary: 197 mg/dL — ABNORMAL HIGH (ref 70–99)
Glucose-Capillary: 148 mg/dL — ABNORMAL HIGH (ref 70–99)
Glucose-Capillary: 126 mg/dL — ABNORMAL HIGH (ref 70–99)
Glucose-Capillary: 175 mg/dL — ABNORMAL HIGH (ref 70–99)
Glucose-Capillary: 156 mg/dL — ABNORMAL HIGH (ref 70–99)

## 2020-10-14 LAB — T4, FREE: Free T4: 1.44 ng/dL — ABNORMAL HIGH (ref 0.61–1.12)

## 2020-10-14 LAB — HEPARIN LEVEL (UNFRACTIONATED): Heparin Unfractionated: 0.43 IU/mL (ref 0.30–0.70)

## 2020-10-14 LAB — TSH: TSH: <0.01 u[IU]/mL — ABNORMAL LOW (ref 0.350–4.500)

## 2020-10-14 MED ORDER — INSULIN ASPART 100 UNIT/ML IJ SOLN
0.0000 [IU] | INTRAMUSCULAR | Status: DC
  Administered 2020-10-14: 3 [IU] via SUBCUTANEOUS
  Administered 2020-10-14: 4 [IU] via SUBCUTANEOUS
  Administered 2020-10-15 (×2): 3 [IU] via SUBCUTANEOUS

## 2020-10-14 MED ORDER — METOPROLOL TARTRATE 50 MG PO TABS
50.0000 mg | ORAL_TABLET | Freq: Two times a day (BID) | ORAL | Status: DC
  Administered 2020-10-14 – 2020-10-15 (×2): 50 mg
  Filled 2020-10-14 (×2): qty 1

## 2020-10-14 MED ORDER — FREE WATER
200.0000 mL | Status: DC
  Administered 2020-10-14 – 2020-10-17 (×19): 200 mL

## 2020-10-14 MED ORDER — FUROSEMIDE 10 MG/ML IJ SOLN
4.0000 mg/h | INTRAVENOUS | Status: DC
  Administered 2020-10-14 – 2020-10-15 (×2): 8 mg/h via INTRAVENOUS
  Filled 2020-10-14 (×2): qty 20

## 2020-10-14 MED ORDER — DEXMEDETOMIDINE HCL IN NACL 400 MCG/100ML IV SOLN
0.0000 ug/kg/h | INTRAVENOUS | Status: AC
  Administered 2020-10-15: 0.2 ug/kg/h via INTRAVENOUS
  Administered 2020-10-15: 0.6 ug/kg/h via INTRAVENOUS
  Administered 2020-10-16 (×2): 0.5 ug/kg/h via INTRAVENOUS
  Administered 2020-10-17: 0.9 ug/kg/h via INTRAVENOUS
  Administered 2020-10-17: 0.7 ug/kg/h via INTRAVENOUS
  Filled 2020-10-14 (×7): qty 100

## 2020-10-14 MED ORDER — METOLAZONE 5 MG PO TABS
5.0000 mg | ORAL_TABLET | Freq: Once | ORAL | Status: AC
  Administered 2020-10-14: 5 mg
  Filled 2020-10-14: qty 1

## 2020-10-14 MED ORDER — FUROSEMIDE 10 MG/ML IJ SOLN
100.0000 mg | Freq: Once | INTRAVENOUS | Status: AC
  Administered 2020-10-14: 100 mg via INTRAVENOUS
  Filled 2020-10-14: qty 10
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
## 2020-10-14 NOTE — Progress Notes (Signed)
ANTICOAGULATION CONSULT NOTE  Pharmacy Consult:  Heparin Indication: CVA and Afib  Allergies  Allergen Reactions  . Doxycycline Monohydrate Itching    *was taking two antibiotic at same time   . Sulfamethoxazole-Trimethoprim Itching    *was taking antibiotics at same time.  Marland Kitchen Penicillin G Rash    Patient Measurements: Height: 5' 8.25" (173.4 cm) Weight: 81.3 kg (179 lb 3.7 oz) IBW/kg (Calculated) : 68.98  Heparin Dosing Weight: 74 kg  Vital Signs: Temp: 98.4 F (36.9 C) (05/30 0800) Temp Source: Axillary (05/30 0800) BP: 125/79 (05/30 1011) Pulse Rate: 115 (05/30 1011)  Labs: Recent Labs    10/12/20 0514 10/12/20 0827 10/13/20 0430 10/13/20 1258 10/14/20 0502  HGB 7.9*  --  8.0*  --  8.3*  HCT 23.6*  --  24.3*  --  25.9*  PLT 186  --  208  --  252  HEPARINUNFRC  --    < > 0.36 0.29* 0.43  CREATININE 4.87*  --   --  5.10* 5.16*   < > = values in this interval not displayed.    Estimated Creatinine Clearance: 12.1 mL/min (A) (by C-G formula based on SCr of 5.16 mg/dL (H)).   Assessment: 75 yo male with CVA likely due to Afib.  He also developed a new, acute infarct on 5/24 CT.  Pharmacy consulted to dose heparin.  Heparin level therapeutic; no bleeding reported.  Goal of Therapy:  Heparin level 0.3-0.5 units/ml Monitor platelets by anticoagulation protocol: Yes   Plan:  Continue heparin gtt at 1300 units/hr Daily heparin level and CBC Monitor closely for bleeding  Javanna Patin D. Mina Marble, PharmD, BCPS, [place] 10/14/2020, 10:15 AM
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
## 2020-10-14 NOTE — Progress Notes (Signed)
Claiborne Billings RN aware of order to d/c CVC.

## 2020-10-15 DIAGNOSIS — N170 Acute kidney failure with tubular necrosis: Secondary | ICD-10-CM | POA: Diagnosis not present

## 2020-10-15 DIAGNOSIS — Z978 Presence of other specified devices: Secondary | ICD-10-CM

## 2020-10-15 LAB — GLUCOSE, CAPILLARY
Glucose-Capillary: 121 mg/dL — ABNORMAL HIGH (ref 70–99)
Glucose-Capillary: 126 mg/dL — ABNORMAL HIGH (ref 70–99)
Glucose-Capillary: 129 mg/dL — ABNORMAL HIGH (ref 70–99)
Glucose-Capillary: 132 mg/dL — ABNORMAL HIGH (ref 70–99)
Glucose-Capillary: 48 mg/dL — ABNORMAL LOW (ref 70–99)
Glucose-Capillary: 50 mg/dL — ABNORMAL LOW (ref 70–99)
Glucose-Capillary: 84 mg/dL (ref 70–99)
Glucose-Capillary: 87 mg/dL (ref 70–99)

## 2020-10-15 LAB — CBC
HCT: 28.5 % — ABNORMAL LOW (ref 39.0–52.0)
Hemoglobin: 9.2 g/dL — ABNORMAL LOW (ref 13.0–17.0)
MCH: 29.6 pg (ref 26.0–34.0)
MCHC: 32.3 g/dL (ref 30.0–36.0)
MCV: 91.6 fL (ref 80.0–100.0)
Platelets: 315 10*3/uL (ref 150–400)
RBC: 3.11 MIL/uL — ABNORMAL LOW (ref 4.22–5.81)
RDW: 14.4 % (ref 11.5–15.5)
WBC: 20.9 10*3/uL — ABNORMAL HIGH (ref 4.0–10.5)
nRBC: 0.2 % (ref 0.0–0.2)

## 2020-10-15 LAB — BASIC METABOLIC PANEL
Anion gap: 15 (ref 5–15)
BUN: 205 mg/dL — ABNORMAL HIGH (ref 8–23)
CO2: 30 mmol/L (ref 22–32)
Calcium: 8.8 mg/dL — ABNORMAL LOW (ref 8.9–10.3)
Chloride: 109 mmol/L (ref 98–111)
Creatinine, Ser: 5.41 mg/dL — ABNORMAL HIGH (ref 0.61–1.24)
GFR, Estimated: 10 mL/min — ABNORMAL LOW (ref >60)
Glucose, Bld: 143 mg/dL — ABNORMAL HIGH (ref 70–99)
Potassium: 4.6 mmol/L (ref 3.5–5.1)
Sodium: 154 mmol/L — ABNORMAL HIGH (ref 135–145)

## 2020-10-15 LAB — HEPATIC FUNCTION PANEL
ALT: 40 U/L (ref 0–44)
AST: 34 U/L (ref 15–41)
Albumin: 2 g/dL — ABNORMAL LOW (ref 3.5–5.0)
Alkaline Phosphatase: 99 U/L (ref 38–126)
Bilirubin, Direct: 0.1 mg/dL (ref 0.0–0.2)
Indirect Bilirubin: 0.6 mg/dL (ref 0.3–0.9)
Total Bilirubin: 0.7 mg/dL (ref 0.3–1.2)
Total Protein: 5.8 g/dL — ABNORMAL LOW (ref 6.5–8.1)

## 2020-10-15 LAB — T4, FREE: Free T4: 1.77 ng/dL — ABNORMAL HIGH (ref 0.61–1.12)

## 2020-10-15 LAB — HEPARIN LEVEL (UNFRACTIONATED): Heparin Unfractionated: 0.48 IU/mL (ref 0.30–0.70)

## 2020-10-15 MED ORDER — ATORVASTATIN CALCIUM 10 MG PO TABS: 20.0000 mg | ORAL_TABLET | Freq: Every day | ORAL | Status: DC

## 2020-10-15 MED ORDER — AMIODARONE HCL IN DEXTROSE 360-4.14 MG/200ML-% IV SOLN
60.0000 mg/h | INTRAVENOUS | Status: DC
  Administered 2020-10-16: 60 mg/h via INTRAVENOUS
  Filled 2020-10-15: qty 200

## 2020-10-15 MED ORDER — PHENYLEPHRINE HCL-NACL 10-0.9 MG/250ML-% IV SOLN
25.0000 ug/min | INTRAVENOUS | Status: AC
  Administered 2020-10-15: 30 ug/min via INTRAVENOUS
  Administered 2020-10-16: 85 ug/min via INTRAVENOUS
  Administered 2020-10-16: 75 ug/min via INTRAVENOUS
  Administered 2020-10-16: 56.667 ug/min via INTRAVENOUS
  Administered 2020-10-16: 13.333 ug/min via INTRAVENOUS
  Administered 2020-10-16: 85 ug/min via INTRAVENOUS
  Administered 2020-10-16 (×3): 75 ug/min via INTRAVENOUS
  Administered 2020-10-16: 45 ug/min via INTRAVENOUS
  Administered 2020-10-17: 85 ug/min via INTRAVENOUS
  Administered 2020-10-17: 80 ug/min via INTRAVENOUS
  Administered 2020-10-17: 85 ug/min via INTRAVENOUS
  Administered 2020-10-17 (×2): 75 ug/min via INTRAVENOUS
  Administered 2020-10-17 (×2): 85 ug/min via INTRAVENOUS
  Administered 2020-10-17: 75 ug/min via INTRAVENOUS
  Administered 2020-10-17: 85 ug/min via INTRAVENOUS
  Administered 2020-10-18: 75 ug/min via INTRAVENOUS
  Administered 2020-10-18: 95 ug/min via INTRAVENOUS
  Administered 2020-10-18 (×2): 85 ug/min via INTRAVENOUS
  Administered 2020-10-18 (×3): 75 ug/min via INTRAVENOUS
  Administered 2020-10-18: 85 ug/min via INTRAVENOUS
  Administered 2020-10-18: 75 ug/min via INTRAVENOUS
  Administered 2020-10-18: 85 ug/min via INTRAVENOUS
  Administered 2020-10-19: 75 ug/min via INTRAVENOUS
  Administered 2020-10-19: 35 ug/min via INTRAVENOUS
  Administered 2020-10-19 (×4): 65 ug/min via INTRAVENOUS
  Administered 2020-10-20 (×7): 175 ug/min via INTRAVENOUS
  Administered 2020-10-20: 20 ug/min via INTRAVENOUS
  Filled 2020-10-15 (×41): qty 250
  Filled 2020-10-15: qty 500
  Filled 2020-10-15 (×4): qty 250

## 2020-10-15 MED ORDER — AMIODARONE HCL IN DEXTROSE 360-4.14 MG/200ML-% IV SOLN
30.0000 mg/h | INTRAVENOUS | Status: DC
  Administered 2020-10-16: 30 mg/h via INTRAVENOUS
  Filled 2020-10-15: qty 200

## 2020-10-15 MED ORDER — QUETIAPINE FUMARATE 25 MG PO TABS
50.0000 mg | ORAL_TABLET | Freq: Two times a day (BID) | ORAL | Status: DC
  Administered 2020-10-15 – 2020-10-20 (×11): 50 mg
  Filled 2020-10-15 (×11): qty 2

## 2020-10-15 MED ORDER — SODIUM CHLORIDE 0.9 % IV SOLN
250.0000 mL | INTRAVENOUS | Status: DC
  Administered 2020-10-17: 250 mL via INTRAVENOUS

## 2020-10-15 MED ORDER — DEXTROSE 50 % IV SOLN
INTRAVENOUS | Status: AC
  Administered 2020-10-15: 25 mL
  Filled 2020-10-15: qty 50

## 2020-10-15 MED ORDER — INSULIN ASPART 100 UNIT/ML IJ SOLN
0.0000 [IU] | INTRAMUSCULAR | Status: DC
  Administered 2020-10-15 – 2020-10-16 (×7): 1 [IU] via SUBCUTANEOUS
  Administered 2020-10-16 (×2): 2 [IU] via SUBCUTANEOUS
  Administered 2020-10-17: 1 [IU] via SUBCUTANEOUS
  Administered 2020-10-17 (×2): 2 [IU] via SUBCUTANEOUS
  Administered 2020-10-17: 1 [IU] via SUBCUTANEOUS
  Administered 2020-10-18 (×2): 2 [IU] via SUBCUTANEOUS
  Administered 2020-10-18 – 2020-10-20 (×7): 1 [IU] via SUBCUTANEOUS
  Administered 2020-10-20: 2 [IU] via SUBCUTANEOUS

## 2020-10-15 MED ORDER — GUAIFENESIN 100 MG/5ML PO SOLN: 5.0000 mL | ORAL | Status: DC | PRN

## 2020-10-15 MED ORDER — GUAIFENESIN 100 MG/5ML PO SOLN
5.0000 mL | ORAL | Status: DC | PRN
  Administered 2020-10-15: 100 mg
  Filled 2020-10-15: qty 225

## 2020-10-15 MED ORDER — METOPROLOL TARTRATE 50 MG PO TABS: 50.0000 mg | ORAL_TABLET | Freq: Three times a day (TID) | ORAL | Status: DC

## 2020-10-15 MED ORDER — PHENYLEPHRINE HCL-NACL 10-0.9 MG/250ML-% IV SOLN
INTRAVENOUS | Status: AC
  Administered 2020-10-15: 25 ug/min via INTRAVENOUS
  Filled 2020-10-15: qty 250

## 2020-10-15 MED ORDER — CLONAZEPAM 0.5 MG PO TABS: 0.2500 mg | ORAL_TABLET | Freq: Two times a day (BID) | ORAL | Status: DC

## 2020-10-15 MED ORDER — FENTANYL CITRATE (PF) 100 MCG/2ML IJ SOLN
INTRAMUSCULAR | Status: AC
  Administered 2020-10-15: 50 ug
  Filled 2020-10-15: qty 2

## 2020-10-15 MED ORDER — OXYCODONE HCL 5 MG/5ML PO SOLN
5.0000 mg | ORAL | Status: DC
  Administered 2020-10-15 – 2020-10-20 (×30): 5 mg
  Filled 2020-10-15 (×30): qty 5

## 2020-10-15 MED ORDER — METOPROLOL TARTRATE 25 MG PO TABS
25.0000 mg | ORAL_TABLET | Freq: Three times a day (TID) | ORAL | Status: DC
  Administered 2020-10-15: 25 mg
  Filled 2020-10-15: qty 1

## 2020-10-15 MED ORDER — AMIODARONE LOAD VIA INFUSION
150.0000 mg | Freq: Once | INTRAVENOUS | Status: AC
  Administered 2020-10-16: 150 mg via INTRAVENOUS
  Filled 2020-10-15: qty 83.34

## 2020-10-15 MED ORDER — ATORVASTATIN CALCIUM 10 MG PO TABS
20.0000 mg | ORAL_TABLET | Freq: Every day | ORAL | Status: DC
  Administered 2020-10-15 – 2020-10-20 (×6): 20 mg
  Filled 2020-10-15 (×6): qty 2

## 2020-10-15 MED ORDER — LORAZEPAM 2 MG/ML IJ SOLN: 0.5000 mg | Freq: Four times a day (QID) | INTRAMUSCULAR | Status: DC | PRN

## 2020-10-15 MED ORDER — PROSOURCE TF PO LIQD
90.0000 mL | Freq: Two times a day (BID) | ORAL | Status: DC
  Administered 2020-10-15 – 2020-10-20 (×10): 90 mL
  Filled 2020-10-15 (×10): qty 90

## 2020-10-15 MED ORDER — CLONAZEPAM 0.25 MG PO TBDP
0.2500 mg | ORAL_TABLET | Freq: Two times a day (BID) | ORAL | Status: DC
  Administered 2020-10-15 – 2020-10-20 (×11): 0.25 mg
  Filled 2020-10-15 (×11): qty 1
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
## 2020-10-15 NOTE — Progress Notes (Signed)
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  Attempted to deflate with about 50% success, pt appears more comfortable, but not completely deflated.  Urology to see later this weekend.    Otilio Carpen Rommel Hogston, PA-C

## 2020-10-15 NOTE — Progress Notes (Signed)
eLink Physician-Brief Progress Note Patient Name: Samuel Barry DOB: 24-Mar-1946 MRN: [id number]   Date of Service  10/15/2020  HPI/Events of Note  AFIB with RVR - Ventricular rate transiently as high as 160. BP currently = 85/61 on a Phenylephrine IV infusion at 20 mcg/min via PIV. Presently also on a Heparin IV infusion.   eICU Interventions  Plan: 1. Titrate Phenylephrine IV infusion as needed to support BP. 2. Amiodarone IV load and infusion. 3. BMP and Mg++ level STAT.     Intervention Category Major Interventions: Arrhythmia - evaluation and management  Keneisha Heckart Cornelia Copa 10/15/2020, 11:56 PM

## 2020-10-15 NOTE — Progress Notes (Signed)
ANTICOAGULATION CONSULT NOTE  Pharmacy Consult:  Heparin Indication: CVA and Afib  Allergies  Allergen Reactions  . Doxycycline Monohydrate Itching    *was taking two antibiotic at same time   . Sulfamethoxazole-Trimethoprim Itching    *was taking antibiotics at same time.  Marland Kitchen Penicillin G Rash    Patient Measurements: Height: 5' 8.25" (173.4 cm) Weight: 81.3 kg (179 lb 3.7 oz) IBW/kg (Calculated) : 68.98  Heparin Dosing Weight: 74 kg  Vital Signs: Temp: 99.3 F (37.4 C) (05/31 0400) Temp Source: Axillary (05/31 0400) BP: 85/62 (05/31 0500) Pulse Rate: 100 (05/31 0500)  Labs: Recent Labs    10/13/20 0430 10/13/20 1258 10/14/20 0502  HGB 8.0*  --  8.3*  HCT 24.3*  --  25.9*  PLT 208  --  252  HEPARINUNFRC 0.36 0.29* 0.43  CREATININE  --  5.10* 5.16*    Estimated Creatinine Clearance: 12.1 mL/min (A) (by C-G formula based on SCr of 5.16 mg/dL (H)).   Assessment: 75 yo male with CVA likely due to Afib.  He also developed a new acute infarct on 5/24 CT.  Pharmacy consulted to dose heparin.  Heparin level therapeutic at 0.48 on 1,300units/hr. No bleeding reported. CBC this AM stable with Hgb ~9, PLT 300s.   Goal of Therapy:  Heparin level 0.3-0.5 units/ml Monitor platelets by anticoagulation protocol: Yes   Plan:  Continue heparin gtt at 1,300 units/hr Follow daily heparin level and CBC Monitor closely for bleeding Follow up with plans for transitioning to apixaban   Mercy Riding, PharmD PGY1 Acute Care Pharmacy Resident Please refer to [facility] for unit-specific pharmacist

## 2020-10-15 NOTE — Progress Notes (Signed)
  Notified of sustained hypotension with SBP now in the 60's.  S/p enteral oxy and seroquel  Lasix gtt since off.    P:  Start peripheral Neo for MAP goal > 65 Hold lasix gtt Reduce scheduled lopressor to 25mg  TID with holding parameters
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
## 2020-10-16 ENCOUNTER — Inpatient Hospital Stay (HOSPITAL_COMMUNITY): Payer: Medicare HMO

## 2020-10-16 DIAGNOSIS — J9600 Acute respiratory failure, unspecified whether with hypoxia or hypercapnia: Secondary | ICD-10-CM

## 2020-10-16 DIAGNOSIS — N170 Acute kidney failure with tubular necrosis: Secondary | ICD-10-CM | POA: Diagnosis not present

## 2020-10-16 LAB — BASIC METABOLIC PANEL
Anion gap: 13 (ref 5–15)
BUN: 214 mg/dL — ABNORMAL HIGH (ref 8–23)
CO2: 28 mmol/L (ref 22–32)
Calcium: 8.4 mg/dL — ABNORMAL LOW (ref 8.9–10.3)
Chloride: 109 mmol/L (ref 98–111)
Creatinine, Ser: 5.53 mg/dL — ABNORMAL HIGH (ref 0.61–1.24)
GFR, Estimated: 10 mL/min — ABNORMAL LOW (ref >60)
Glucose, Bld: 161 mg/dL — ABNORMAL HIGH (ref 70–99)
Potassium: 4.6 mmol/L (ref 3.5–5.1)
Sodium: 150 mmol/L — ABNORMAL HIGH (ref 135–145)

## 2020-10-16 LAB — CBC
HCT: 27.2 % — ABNORMAL LOW (ref 39.0–52.0)
Hemoglobin: 8.7 g/dL — ABNORMAL LOW (ref 13.0–17.0)
MCH: 29.5 pg (ref 26.0–34.0)
MCHC: 32 g/dL (ref 30.0–36.0)
MCV: 92.2 fL (ref 80.0–100.0)
Platelets: 322 10*3/uL (ref 150–400)
RBC: 2.95 MIL/uL — ABNORMAL LOW (ref 4.22–5.81)
RDW: 14.6 % (ref 11.5–15.5)
WBC: 20 10*3/uL — ABNORMAL HIGH (ref 4.0–10.5)
nRBC: 0.1 % (ref 0.0–0.2)

## 2020-10-16 LAB — GLUCOSE, CAPILLARY
Glucose-Capillary: 121 mg/dL — ABNORMAL HIGH (ref 70–99)
Glucose-Capillary: 125 mg/dL — ABNORMAL HIGH (ref 70–99)
Glucose-Capillary: 136 mg/dL — ABNORMAL HIGH (ref 70–99)
Glucose-Capillary: 144 mg/dL — ABNORMAL HIGH (ref 70–99)
Glucose-Capillary: 152 mg/dL — ABNORMAL HIGH (ref 70–99)
Glucose-Capillary: 155 mg/dL — ABNORMAL HIGH (ref 70–99)

## 2020-10-16 LAB — MAGNESIUM: Magnesium: 2.2 mg/dL (ref 1.7–2.4)

## 2020-10-16 LAB — HEPARIN LEVEL (UNFRACTIONATED): Heparin Unfractionated: 0.35 IU/mL (ref 0.30–0.70)

## 2020-10-16 MED ORDER — PROPRANOLOL HCL 20 MG PO TABS
20.0000 mg | ORAL_TABLET | Freq: Three times a day (TID) | ORAL | Status: DC
  Administered 2020-10-16 – 2020-10-20 (×14): 20 mg
  Filled 2020-10-16 (×14): qty 1

## 2020-10-16 MED ORDER — BACLOFEN 1 MG/ML ORAL SUSPENSION: 10.0000 mg | Freq: Once | ORAL | Status: DC

## 2020-10-16 MED ORDER — ALBUMIN HUMAN 5 % IV SOLN
25.0000 g | Freq: Once | INTRAVENOUS | Status: AC
  Administered 2020-10-16: 25 g via INTRAVENOUS
  Filled 2020-10-16: qty 500

## 2020-10-16 MED ORDER — BACLOFEN 10 MG PO TABS
10.0000 mg | ORAL_TABLET | Freq: Once | ORAL | Status: AC
  Administered 2020-10-16: 10 mg
  Filled 2020-10-16: qty 1
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
## 2020-10-16 NOTE — Progress Notes (Signed)
ANTICOAGULATION CONSULT NOTE  Pharmacy Consult:  Heparin Indication: CVA and Afib  Allergies  Allergen Reactions  . Doxycycline Monohydrate Itching    *was taking two antibiotic at same time   . Sulfamethoxazole-Trimethoprim Itching    *was taking antibiotics at same time.  Marland Kitchen Penicillin G Rash    Patient Measurements: Height: 5' 8.25" (173.4 cm) Weight: 76 kg (167 lb 8.8 oz) IBW/kg (Calculated) : 68.98  Heparin Dosing Weight: 74 kg  Vital Signs: Temp: 99.6 F (37.6 C) (06/01 0400) Temp Source: Axillary (06/01 0400) BP: 97/63 (06/01 0700) Pulse Rate: 70 (06/01 0320)  Labs: Recent Labs    10/14/20 0502 10/15/20 0738 10/16/20 0028  HGB 8.3* 9.2* 8.7*  HCT 25.9* 28.5* 27.2*  PLT 252 315 322  HEPARINUNFRC 0.43 0.48 0.35  CREATININE 5.16* 5.41* 5.53*    Estimated Creatinine Clearance: 11.3 mL/min (A) (by C-G formula based on SCr of 5.53 mg/dL (H)).   Assessment: 75 yo male with CVA likely due to Afib.  He also developed a new acute infarct on 5/24 CT.  Pharmacy consulted to dose heparin.  Heparin level remains therapeutic (HL 0.35 << 0.48, goal of 0.3-0.5). Hgb low but stable - no bleeding reported.    Goal of Therapy:  Heparin level 0.3-0.5 units/ml Monitor platelets by anticoagulation protocol: Yes   Plan:  Continue heparin gtt at 1,300 units/hr Follow daily heparin level and CBC Monitor closely for bleeding Follow up with plans for transitioning to apixaban   Thank you for allowing pharmacy to be a part of this patient's care.  Alycia Rossetti, PharmD, BCPS Clinical Pharmacist Clinical phone for 10/16/2020: [phone] 10/16/2020 11:17 AM   **Pharmacist phone directory can now be found on [email or web address] ([email or web address]).  Listed under [place].

## 2020-10-16 DEATH — deceased

## 2020-10-17 DIAGNOSIS — N170 Acute kidney failure with tubular necrosis: Secondary | ICD-10-CM | POA: Diagnosis not present

## 2020-10-17 LAB — BASIC METABOLIC PANEL
Anion gap: 13 (ref 5–15)
BUN: 183 mg/dL — ABNORMAL HIGH (ref 8–23)
CO2: 26 mmol/L (ref 22–32)
Calcium: 8.6 mg/dL — ABNORMAL LOW (ref 8.9–10.3)
Chloride: 112 mmol/L — ABNORMAL HIGH (ref 98–111)
Creatinine, Ser: 5.06 mg/dL — ABNORMAL HIGH (ref 0.61–1.24)
GFR, Estimated: 11 mL/min — ABNORMAL LOW (ref >60)
Glucose, Bld: 120 mg/dL — ABNORMAL HIGH (ref 70–99)
Potassium: 4.8 mmol/L (ref 3.5–5.1)
Sodium: 151 mmol/L — ABNORMAL HIGH (ref 135–145)

## 2020-10-17 LAB — CBC
HCT: 26.2 % — ABNORMAL LOW (ref 39.0–52.0)
Hemoglobin: 8.1 g/dL — ABNORMAL LOW (ref 13.0–17.0)
MCH: 29.1 pg (ref 26.0–34.0)
MCHC: 30.9 g/dL (ref 30.0–36.0)
MCV: 94.2 fL (ref 80.0–100.0)
Platelets: 310 10*3/uL (ref 150–400)
RBC: 2.78 MIL/uL — ABNORMAL LOW (ref 4.22–5.81)
RDW: 15 % (ref 11.5–15.5)
WBC: 17.8 10*3/uL — ABNORMAL HIGH (ref 4.0–10.5)
nRBC: 0.1 % (ref 0.0–0.2)

## 2020-10-17 LAB — GLUCOSE, CAPILLARY
Glucose-Capillary: 115 mg/dL — ABNORMAL HIGH (ref 70–99)
Glucose-Capillary: 116 mg/dL — ABNORMAL HIGH (ref 70–99)
Glucose-Capillary: 134 mg/dL — ABNORMAL HIGH (ref 70–99)
Glucose-Capillary: 148 mg/dL — ABNORMAL HIGH (ref 70–99)
Glucose-Capillary: 170 mg/dL — ABNORMAL HIGH (ref 70–99)
Glucose-Capillary: 181 mg/dL — ABNORMAL HIGH (ref 70–99)

## 2020-10-17 LAB — CULTURE, BLOOD (ROUTINE X 2)
Culture: NO GROWTH
Culture: NO GROWTH

## 2020-10-17 LAB — HEPARIN LEVEL (UNFRACTIONATED)
Heparin Unfractionated: 0.27 IU/mL — ABNORMAL LOW (ref 0.30–0.70)
Heparin Unfractionated: 0.4 IU/mL (ref 0.30–0.70)

## 2020-10-17 MED ORDER — DEXMEDETOMIDINE HCL IN NACL 400 MCG/100ML IV SOLN
0.4000 ug/kg/h | INTRAVENOUS | Status: AC
  Administered 2020-10-17 – 2020-10-18 (×3): 0.9 ug/kg/h via INTRAVENOUS
  Administered 2020-10-18: 0.4 ug/kg/h via INTRAVENOUS
  Administered 2020-10-18 – 2020-10-19 (×3): 0.9 ug/kg/h via INTRAVENOUS
  Administered 2020-10-19: 1 ug/kg/h via INTRAVENOUS
  Administered 2020-10-19: 0.9 ug/kg/h via INTRAVENOUS
  Administered 2020-10-19: 1.2 ug/kg/h via INTRAVENOUS
  Administered 2020-10-20: 1.1 ug/kg/h via INTRAVENOUS
  Administered 2020-10-20: 1 ug/kg/h via INTRAVENOUS
  Filled 2020-10-17: qty 100
  Filled 2020-10-17: qty 200
  Filled 2020-10-17 (×8): qty 100

## 2020-10-17 MED ORDER — GUAIFENESIN 100 MG/5ML PO SOLN
15.0000 mL | Freq: Four times a day (QID) | ORAL | Status: DC
  Filled 2020-10-17: qty 15

## 2020-10-17 MED ORDER — LACTATED RINGERS IV BOLUS
500.0000 mL | Freq: Once | INTRAVENOUS | Status: AC
  Administered 2020-10-17: 500 mL via INTRAVENOUS

## 2020-10-17 MED ORDER — GUAIFENESIN 100 MG/5ML PO SOLN
15.0000 mL | Freq: Four times a day (QID) | ORAL | Status: DC
  Administered 2020-10-17 – 2020-10-20 (×13): 300 mg
  Filled 2020-10-17 (×12): qty 15

## 2020-10-17 MED ORDER — FREE WATER
300.0000 mL | Status: DC
  Administered 2020-10-17 – 2020-10-18 (×8): 300 mL
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
## 2020-10-17 NOTE — Progress Notes (Signed)
ANTICOAGULATION CONSULT NOTE  Pharmacy Consult:  Heparin Indication: CVA and Afib  Allergies  Allergen Reactions  . Doxycycline Monohydrate Itching    *was taking two antibiotic at same time   . Sulfamethoxazole-Trimethoprim Itching    *was taking antibiotics at same time.  Marland Kitchen Penicillin G Rash    Patient Measurements: Height: 5' 8.25" (173.4 cm) Weight: 76 kg (167 lb 8.8 oz) IBW/kg (Calculated) : 68.98  Heparin Dosing Weight: 74 kg  Vital Signs: Temp: 99.9 F (37.7 C) (06/02 1600) Temp Source: Axillary (06/02 1600) BP: 116/77 (06/02 1900) Pulse Rate: 74 (06/02 1900)  Labs: Recent Labs    10/15/20 0738 10/16/20 0028 10/17/20 0152 10/17/20 1809  HGB 9.2* 8.7* 8.1*  --   HCT 28.5* 27.2* 26.2*  --   PLT 315 322 310  --   HEPARINUNFRC 0.48 0.35 0.27* 0.40  CREATININE 5.41* 5.53* 5.06*  --     Estimated Creatinine Clearance: 12.3 mL/min (A) (by C-G formula based on SCr of 5.06 mg/dL (H)).   Assessment: 75 yo male with CVA likely due to Afib.  He also developed a new acute infarct on 5/24 CT.  Pharmacy consulted to dose heparin.  Heparin level at goal after increase to 1350 units/hr  Goal of Therapy:  Heparin level 0.3-0.5 units/ml Monitor platelets by anticoagulation protocol: Yes   Plan:  Continue heparin1350 unit/hr Follow daily heparin level and CBC    Hildred Laser, PharmD Clinical Pharmacist **Pharmacist phone directory can now be found on [email or web address] ([email or web address]).  Listed under [place].
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
## 2020-10-17 NOTE — Progress Notes (Signed)
ANTICOAGULATION CONSULT NOTE  Pharmacy Consult:  Heparin Indication: CVA and Afib  Allergies  Allergen Reactions  . Doxycycline Monohydrate Itching    *was taking two antibiotic at same time   . Sulfamethoxazole-Trimethoprim Itching    *was taking antibiotics at same time.  Marland Kitchen Penicillin G Rash    Patient Measurements: Height: 5' 8.25" (173.4 cm) Weight: 76 kg (167 lb 8.8 oz) IBW/kg (Calculated) : 68.98  Heparin Dosing Weight: 74 kg  Vital Signs: Temp: 101.4 F (38.6 C) (06/02 0400) Temp Source: Axillary (06/02 0400) BP: 103/66 (06/02 0600) Pulse Rate: 77 (06/02 0600)  Labs: Recent Labs    10/15/20 0738 10/16/20 0028 10/17/20 0152  HGB 9.2* 8.7* 8.1*  HCT 28.5* 27.2* 26.2*  PLT 315 322 310  HEPARINUNFRC 0.48 0.35 0.27*  CREATININE 5.41* 5.53*  --     Estimated Creatinine Clearance: 11.3 mL/min (A) (by C-G formula based on SCr of 5.53 mg/dL (H)).   Assessment: 75 yo male with CVA likely due to Afib.  He also developed a new acute infarct on 5/24 CT.  Pharmacy consulted to dose heparin.  Heparin level slightly below goal at 0.27 this morning (6/2). (HL goal of 0.3-0.5). Hgb low but stable - no bleeding reported. No notable overnight events per RN.   Goal of Therapy:  Heparin level 0.3-0.5 units/ml Monitor platelets by anticoagulation protocol: Yes   Plan:  Increase heparin to 1350 unit/hr Re-check HL in 8 hours Follow daily heparin level and CBC Monitor closely for bleeding Follow up with plans for transitioning to apixaban   Thank you for allowing pharmacy to be a part of this patient's care.  Lorelei Pont, PharmD, BCPS Clinical Pharmacist 10/17/2020 7:45 AM

## 2020-10-18 DIAGNOSIS — J9600 Acute respiratory failure, unspecified whether with hypoxia or hypercapnia: Secondary | ICD-10-CM

## 2020-10-18 DIAGNOSIS — E870 Hyperosmolality and hypernatremia: Secondary | ICD-10-CM

## 2020-10-18 LAB — BASIC METABOLIC PANEL
Anion gap: 6 (ref 5–15)
BUN: 153 mg/dL — ABNORMAL HIGH (ref 8–23)
CO2: 26 mmol/L (ref 22–32)
Calcium: 8.5 mg/dL — ABNORMAL LOW (ref 8.9–10.3)
Chloride: 119 mmol/L — ABNORMAL HIGH (ref 98–111)
Creatinine, Ser: 4.34 mg/dL — ABNORMAL HIGH (ref 0.61–1.24)
GFR, Estimated: 13 mL/min — ABNORMAL LOW (ref >60)
Glucose, Bld: 136 mg/dL — ABNORMAL HIGH (ref 70–99)
Potassium: 4.4 mmol/L (ref 3.5–5.1)
Sodium: 151 mmol/L — ABNORMAL HIGH (ref 135–145)

## 2020-10-18 LAB — CBC
HCT: 25 % — ABNORMAL LOW (ref 39.0–52.0)
Hemoglobin: 7.9 g/dL — ABNORMAL LOW (ref 13.0–17.0)
MCH: 29.6 pg (ref 26.0–34.0)
MCHC: 31.6 g/dL (ref 30.0–36.0)
MCV: 93.6 fL (ref 80.0–100.0)
Platelets: 347 10*3/uL (ref 150–400)
RBC: 2.67 MIL/uL — ABNORMAL LOW (ref 4.22–5.81)
RDW: 15 % (ref 11.5–15.5)
WBC: 18.4 10*3/uL — ABNORMAL HIGH (ref 4.0–10.5)
nRBC: 0 % (ref 0.0–0.2)

## 2020-10-18 LAB — HEPARIN LEVEL (UNFRACTIONATED)
Heparin Unfractionated: 0.27 IU/mL — ABNORMAL LOW (ref 0.30–0.70)
Heparin Unfractionated: 0.31 IU/mL (ref 0.30–0.70)

## 2020-10-18 LAB — GLUCOSE, CAPILLARY
Glucose-Capillary: 102 mg/dL — ABNORMAL HIGH (ref 70–99)
Glucose-Capillary: 123 mg/dL — ABNORMAL HIGH (ref 70–99)
Glucose-Capillary: 124 mg/dL — ABNORMAL HIGH (ref 70–99)
Glucose-Capillary: 129 mg/dL — ABNORMAL HIGH (ref 70–99)
Glucose-Capillary: 170 mg/dL — ABNORMAL HIGH (ref 70–99)
Glucose-Capillary: 186 mg/dL — ABNORMAL HIGH (ref 70–99)

## 2020-10-18 LAB — CULTURE, RESPIRATORY W GRAM STAIN

## 2020-10-18 LAB — MAGNESIUM: Magnesium: 1.7 mg/dL (ref 1.7–2.4)

## 2020-10-18 LAB — T4, FREE: Free T4: 1.38 ng/dL — ABNORMAL HIGH (ref 0.61–1.12)

## 2020-10-18 MED ORDER — FREE WATER
300.0000 mL | Status: DC
  Administered 2020-10-18 – 2020-10-20 (×20): 300 mL
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
## 2020-10-18 NOTE — Progress Notes (Signed)
ANTICOAGULATION CONSULT NOTE  Pharmacy Consult:  Heparin Indication: CVA and Afib  Allergies  Allergen Reactions  . Doxycycline Monohydrate Itching    *was taking two antibiotic at same time   . Sulfamethoxazole-Trimethoprim Itching    *was taking antibiotics at same time.  Marland Kitchen Penicillin G Rash    Patient Measurements: Height: 5' 8.25" (173.4 cm) Weight: 76 kg (167 lb 8.8 oz) IBW/kg (Calculated) : 68.98  Heparin Dosing Weight: 74 kg  Vital Signs: Temp: 98.3 F (36.8 C) (06/03 1600) Temp Source: Axillary (06/03 1600) BP: 96/68 (06/03 1830) Pulse Rate: 73 (06/03 1830)  Labs: Recent Labs    10/16/20 0028 10/17/20 0152 10/17/20 1809 10/18/20 0231 10/18/20 0933 10/18/20 1725  HGB 8.7* 8.1*  --  7.9*  --   --   HCT 27.2* 26.2*  --  25.0*  --   --   PLT 322 310  --  347  --   --   HEPARINUNFRC 0.35 0.27* 0.40 0.27*  --  0.31  CREATININE 5.53* 5.06*  --   --  4.34*  --     Estimated Creatinine Clearance: 14.4 mL/min (A) (by C-G formula based on SCr of 4.34 mg/dL (H)).   Assessment: 75 yo male with CVA likely due to Afib.  He also developed a new acute infarct on 5/24 CT.  Pharmacy consulted to dose heparin.  Heparin level now therapeutic at 0.31.  Goal of Therapy:  Heparin level 0.3-0.5 units/ml Monitor platelets by anticoagulation protocol: Yes   Plan:  -Continue heparin 1400 units/h -Daily heparin level and CBC   Arrie Senate, PharmD, BCPS, [facility] Clinical Pharmacist [phone] Please check AMION for all [facility] Pharmacy numbers 10/18/2020

## 2020-10-18 NOTE — Progress Notes (Signed)
On wake up assessment pt shaking, coughing, HR elevated, gagging. Sedation resumed.

## 2020-10-18 NOTE — Progress Notes (Signed)
ANTICOAGULATION CONSULT NOTE  Pharmacy Consult:  Heparin Indication: CVA and Afib  Allergies  Allergen Reactions  . Doxycycline Monohydrate Itching    *was taking two antibiotic at same time   . Sulfamethoxazole-Trimethoprim Itching    *was taking antibiotics at same time.  Marland Kitchen Penicillin G Rash    Patient Measurements: Height: 5' 8.25" (173.4 cm) Weight: 76 kg (167 lb 8.8 oz) IBW/kg (Calculated) : 68.98  Heparin Dosing Weight: 74 kg  Vital Signs: Temp: 98 F (36.7 C) (06/03 0400) Temp Source: Oral (06/03 0400) BP: 117/73 (06/03 0600) Pulse Rate: 74 (06/03 0600)  Labs: Recent Labs    10/15/20 0738 10/16/20 0028 10/17/20 0152 10/17/20 1809 10/18/20 0231  HGB 9.2* 8.7* 8.1*  --  7.9*  HCT 28.5* 27.2* 26.2*  --  25.0*  PLT 315 322 310  --  347  HEPARINUNFRC 0.48 0.35 0.27* 0.40 0.27*  CREATININE 5.41* 5.53* 5.06*  --   --     Estimated Creatinine Clearance: 12.3 mL/min (A) (by C-G formula based on SCr of 5.06 mg/dL (H)).   Assessment: 75 yo male with CVA likely due to Afib.  He also developed a new acute infarct on 5/24 CT.  Pharmacy consulted to dose heparin.  Heparin level this morning is slightly SUBtherapeutic (HL 0.27, goal of 0.3-0.5). Hgb slight drop to 7.9, plts wnl. No bleeding noted.  Goal of Therapy:  Heparin level 0.3-0.5 units/ml Monitor platelets by anticoagulation protocol: Yes   Plan:  - Increase Heparin to 1400 units/hr (14 ml/hr) - Will continue to monitor for any signs/symptoms of bleeding and will follow up with heparin level in 8 hours   Thank you for allowing pharmacy to be a part of this patient's care.  Alycia Rossetti, PharmD, BCPS Clinical Pharmacist Clinical phone for 10/18/2020: [phone] 10/18/2020 7:25 AM   **Pharmacist phone directory can now be found on [email or web address] ([email or web address]).  Listed under [place].

## 2020-10-19 DIAGNOSIS — I63433 Cerebral infarction due to embolism of bilateral posterior cerebral arteries: Secondary | ICD-10-CM | POA: Diagnosis not present

## 2020-10-19 LAB — HEPARIN LEVEL (UNFRACTIONATED)
Heparin Unfractionated: 0.16 IU/mL — ABNORMAL LOW (ref 0.30–0.70)
Heparin Unfractionated: 0.15 IU/mL — ABNORMAL LOW (ref 0.30–0.70)

## 2020-10-19 LAB — BASIC METABOLIC PANEL
Anion gap: 12 (ref 5–15)
BUN: 139 mg/dL — ABNORMAL HIGH (ref 8–23)
CO2: 25 mmol/L (ref 22–32)
Calcium: 8.5 mg/dL — ABNORMAL LOW (ref 8.9–10.3)
Chloride: 115 mmol/L — ABNORMAL HIGH (ref 98–111)
Creatinine, Ser: 3.99 mg/dL — ABNORMAL HIGH (ref 0.61–1.24)
GFR, Estimated: 15 mL/min — ABNORMAL LOW (ref >60)
Glucose, Bld: 147 mg/dL — ABNORMAL HIGH (ref 70–99)
Potassium: 4.7 mmol/L (ref 3.5–5.1)
Sodium: 152 mmol/L — ABNORMAL HIGH (ref 135–145)

## 2020-10-19 LAB — CBC
HCT: 26.1 % — ABNORMAL LOW (ref 39.0–52.0)
Hemoglobin: 8.1 g/dL — ABNORMAL LOW (ref 13.0–17.0)
MCH: 29.6 pg (ref 26.0–34.0)
MCHC: 31 g/dL (ref 30.0–36.0)
MCV: 95.3 fL (ref 80.0–100.0)
Platelets: 337 10*3/uL (ref 150–400)
RBC: 2.74 MIL/uL — ABNORMAL LOW (ref 4.22–5.81)
RDW: 15.1 % (ref 11.5–15.5)
WBC: 19.1 10*3/uL — ABNORMAL HIGH (ref 4.0–10.5)
nRBC: 0 % (ref 0.0–0.2)

## 2020-10-19 LAB — GLUCOSE, CAPILLARY
Glucose-Capillary: 144 mg/dL — ABNORMAL HIGH (ref 70–99)
Glucose-Capillary: 111 mg/dL — ABNORMAL HIGH (ref 70–99)
Glucose-Capillary: 110 mg/dL — ABNORMAL HIGH (ref 70–99)
Glucose-Capillary: 127 mg/dL — ABNORMAL HIGH (ref 70–99)
Glucose-Capillary: 119 mg/dL — ABNORMAL HIGH (ref 70–99)
Glucose-Capillary: 139 mg/dL — ABNORMAL HIGH (ref 70–99)

## 2020-10-19 MED ORDER — CEFAZOLIN SODIUM-DEXTROSE 1-4 GM/50ML-% IV SOLN
1.0000 g | Freq: Two times a day (BID) | INTRAVENOUS | Status: DC
  Administered 2020-10-19 – 2020-10-20 (×3): 1 g via INTRAVENOUS
  Filled 2020-10-19 (×3): qty 50

## 2020-10-19 NOTE — Progress Notes (Signed)
   10/19/20 0744  Airway 7.5 mm  Placement Date/Time: 10/08/20 1315   Placed By: (c) ICU physician  Airway Device: Endotracheal Tube  ETT Types: Oral  Size (mm): 7.5 mm  Cuffed: Cuffed  Insertion attempts: 1  Airway Equipment: Stylet;Video Laryngoscope  Placement Confirmation: Direct...  Secured at (cm) 26 cm  Measured From Lips  Secured Location Left  Secured By Actuary Repositioned Yes  Prone position No  Cuff Pressure (cm H2O) Green OR 18-26 CmH2O  Site Condition Dry;Edema  Adult Ventilator Settings  Vent Type Servo U  Humidity HME  Vent Mode PSV;CPAP  FiO2 (%) 40 %  Pressure Support 8 cmH20  PEEP 5 cmH20  Adult Ventilator Measurements  Peak Airway Pressure 14 L/min  Mean Airway Pressure 9 cmH20  Resp Rate Spontaneous 25 br/min  Resp Rate Total 25 br/min  Spont TV 519 mL  Measured Ve 11.8 mL  Auto PEEP 0 cmH20  Total PEEP 5 cmH20  SpO2 96 %  Adult Ventilator Alarms  Alarms On Y  Ve High Alarm 20 L/min  Ve Low Alarm 4 L/min  Resp Rate High Alarm 38 br/min  Resp Rate Low Alarm 8  PEEP Low Alarm 3 cmH2O  Press High Alarm 45 cmH2O  T Apnea 20 sec(s)  VAP Prevention  HME changed No  Ventilator changed No  Transported while on vent No  HOB> 30 Degrees Y  Equipment wiped down Yes  Daily Weaning Assessment  Daily Assessment of Readiness to Wean Wean protocol criteria met (SBT performed)  SBT Method CPAP 5 cm H20 and PS 5 cm H20 (8/5)  Weaning Start Time 0740  Patient response Passed (Tolerated well)  Breath Sounds  Bilateral Breath Sounds Rhonchi  Airway Suctioning/Secretions  Suction Type ETT  Suction Device  Catheter  Secretion Amount Small  Secretion Color Tan  Secretion Consistency Thick  Suction Tolerance Tolerated well  Suctioning Adverse Effects None
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
## 2020-10-19 NOTE — Progress Notes (Signed)
ANTICOAGULATION CONSULT NOTE  Pharmacy Consult:  Heparin Indication: CVA and Afib  Allergies  Allergen Reactions  . Doxycycline Monohydrate Itching    *was taking two antibiotic at same time   . Sulfamethoxazole-Trimethoprim Itching    *was taking antibiotics at same time.  Marland Kitchen Penicillin G Rash    Patient Measurements: Height: 5' 8.25" (173.4 cm) Weight: 76 kg (167 lb 8.8 oz) IBW/kg (Calculated) : 68.98  Heparin Dosing Weight: 74 kg  Vital Signs: Temp: 98.7 F (37.1 C) (06/04 0400) Temp Source: Oral (06/04 0400) BP: 91/69 (06/04 0545) Pulse Rate: 116 (06/04 0500)  Labs: Recent Labs    10/17/20 0152 10/17/20 1809 10/18/20 0231 10/18/20 0933 10/18/20 1725 10/19/20 0243  HGB 8.1*  --  7.9*  --   --  8.1*  HCT 26.2*  --  25.0*  --   --  26.1*  PLT 310  --  347  --   --  337  HEPARINUNFRC 0.27*   < > 0.27*  --  0.31 0.16*  CREATININE 5.06*  --   --  4.34*  --  3.99*   < > = values in this interval not displayed.    Estimated Creatinine Clearance: 15.6 mL/min (A) (by C-G formula based on SCr of 3.99 mg/dL (H)).   Assessment: 75 yo male with CVA likely due to Afib.  He also developed a new acute infarct on 5/24 CT.  Pharmacy consulted to dose heparin.  6/4 AM update:  Heparin level low No issues per RN  Goal of Therapy:  Heparin level 0.3-0.5 units/ml Monitor platelets by anticoagulation protocol: Yes   Plan:  -Inc heparin to 1500 units/hr -1400 heparin level  Narda Bonds, PharmD, BCPS Clinical Pharmacist Phone: [phone]

## 2020-10-19 NOTE — Progress Notes (Signed)
ANTICOAGULATION CONSULT NOTE  Pharmacy Consult:  Heparin Indication: CVA and Afib  Allergies  Allergen Reactions  . Doxycycline Monohydrate Itching    *was taking two antibiotic at same time   . Sulfamethoxazole-Trimethoprim Itching    *was taking antibiotics at same time.  Marland Kitchen Penicillin G Rash    Patient Measurements: Height: 5' 8.25" (173.4 cm) Weight: 86.9 kg (191 lb 9.3 oz) IBW/kg (Calculated) : 68.98  Heparin Dosing Weight: 74 kg  Vital Signs: Temp: 101.6 F (38.7 C) (06/04 1100) Temp Source: Axillary (06/04 1100) BP: 126/81 (06/04 1400) Pulse Rate: 97 (06/04 1342)  Labs: Recent Labs    10/17/20 0152 10/17/20 1809 10/18/20 0231 10/18/20 0933 10/18/20 1725 10/19/20 0243 10/19/20 1415  HGB 8.1*  --  7.9*  --   --  8.1*  --   HCT 26.2*  --  25.0*  --   --  26.1*  --   PLT 310  --  347  --   --  337  --   HEPARINUNFRC 0.27*   < > 0.27*  --  0.31 0.16* 0.15*  CREATININE 5.06*  --   --  4.34*  --  3.99*  --    < > = values in this interval not displayed.    Estimated Creatinine Clearance: 17.2 mL/min (A) (by C-G formula based on SCr of 3.99 mg/dL (H)).   Assessment: 75 yo male with CVA likely due to Afib.  He also developed a new acute infarct on 5/24 CT.  Pharmacy consulted to dose heparin.  Heparin level remains sub-therapeutic.  Spoke to RN, IV site intact, no interruption with infusion and pump infusing at 1500 units/hr as ordered.  No bleeding reported.  Goal of Therapy:  Heparin level 0.3-0.5 units/ml Monitor platelets by anticoagulation protocol: Yes   Plan:  Increase heparin gtt to 1650 units/hr Check 8 hr heparin level  Aerik Polan D. Mina Marble, PharmD, BCPS, [place] 10/19/2020, 3:17 PM

## 2020-10-20 DIAGNOSIS — I63433 Cerebral infarction due to embolism of bilateral posterior cerebral arteries: Secondary | ICD-10-CM | POA: Diagnosis not present

## 2020-10-20 LAB — BASIC METABOLIC PANEL
Anion gap: 12 (ref 5–15)
BUN: 120 mg/dL — ABNORMAL HIGH (ref 8–23)
CO2: 19 mmol/L — ABNORMAL LOW (ref 22–32)
Calcium: 8.1 mg/dL — ABNORMAL LOW (ref 8.9–10.3)
Chloride: 112 mmol/L — ABNORMAL HIGH (ref 98–111)
Creatinine, Ser: 3.22 mg/dL — ABNORMAL HIGH (ref 0.61–1.24)
GFR, Estimated: 19 mL/min — ABNORMAL LOW (ref >60)
Glucose, Bld: 144 mg/dL — ABNORMAL HIGH (ref 70–99)
Potassium: 4.2 mmol/L (ref 3.5–5.1)
Sodium: 143 mmol/L (ref 135–145)

## 2020-10-20 LAB — PROTIME-INR
INR: 1.4 — ABNORMAL HIGH (ref 0.8–1.2)
Prothrombin Time: 17.2 seconds — ABNORMAL HIGH (ref 11.4–15.2)

## 2020-10-20 LAB — CBC
HCT: 20.4 % — ABNORMAL LOW (ref 39.0–52.0)
Hemoglobin: 6.5 g/dL — CL (ref 13.0–17.0)
MCH: 30 pg (ref 26.0–34.0)
MCHC: 31.9 g/dL (ref 30.0–36.0)
MCV: 94 fL (ref 80.0–100.0)
Platelets: 269 10*3/uL (ref 150–400)
RBC: 2.17 MIL/uL — ABNORMAL LOW (ref 4.22–5.81)
RDW: 14.7 % (ref 11.5–15.5)
WBC: 30.2 10*3/uL — ABNORMAL HIGH (ref 4.0–10.5)
nRBC: 0 % (ref 0.0–0.2)

## 2020-10-20 LAB — GLUCOSE, CAPILLARY
Glucose-Capillary: 120 mg/dL — ABNORMAL HIGH (ref 70–99)
Glucose-Capillary: 151 mg/dL — ABNORMAL HIGH (ref 70–99)
Glucose-Capillary: 136 mg/dL — ABNORMAL HIGH (ref 70–99)
Glucose-Capillary: 124 mg/dL — ABNORMAL HIGH (ref 70–99)

## 2020-10-20 LAB — HEPARIN LEVEL (UNFRACTIONATED)
Heparin Unfractionated: 0.17 IU/mL — ABNORMAL LOW (ref 0.30–0.70)
Heparin Unfractionated: 0.25 IU/mL — ABNORMAL LOW (ref 0.30–0.70)

## 2020-10-20 LAB — HEMOGLOBIN AND HEMATOCRIT, BLOOD
HCT: 20.5 % — ABNORMAL LOW (ref 39.0–52.0)
Hemoglobin: 6.5 g/dL — CL (ref 13.0–17.0)

## 2020-10-20 LAB — PREPARE RBC (CROSSMATCH)

## 2020-10-20 MED ORDER — GLYCOPYRROLATE 1 MG PO TABS
1.0000 mg | ORAL_TABLET | ORAL | Status: DC | PRN
  Filled 2020-10-20: qty 1

## 2020-10-20 MED ORDER — GLYCOPYRROLATE 0.2 MG/ML IJ SOLN: 0.2000 mg | INTRAMUSCULAR | Status: DC | PRN

## 2020-10-20 MED ORDER — MORPHINE BOLUS VIA INFUSION
5.0000 mg | INTRAVENOUS | Status: DC | PRN
  Filled 2020-10-20: qty 5

## 2020-10-20 MED ORDER — FREE WATER
200.0000 mL | Status: DC
  Administered 2020-10-20 (×2): 200 mL

## 2020-10-20 MED ORDER — GLYCOPYRROLATE 0.2 MG/ML IJ SOLN
0.2000 mg | INTRAMUSCULAR | Status: DC | PRN
  Administered 2020-10-20: 0.2 mg via INTRAVENOUS
  Filled 2020-10-20: qty 1

## 2020-10-20 MED ORDER — MORPHINE SULFATE (PF) 2 MG/ML IV SOLN: 2.0000 mg | INTRAVENOUS | Status: DC | PRN

## 2020-10-20 MED ORDER — MIDAZOLAM HCL 2 MG/2ML IJ SOLN: 1.0000 mg | INTRAMUSCULAR | Status: DC | PRN

## 2020-10-20 MED ORDER — MORPHINE 100MG IN NS 100ML (1MG/ML) PREMIX INFUSION
0.0000 mg/h | INTRAVENOUS | Status: DC
  Administered 2020-10-20: 2 mg/h via INTRAVENOUS
  Administered 2020-10-21: 11 mg/h via INTRAVENOUS
  Filled 2020-10-20 (×2): qty 100

## 2020-10-20 MED ORDER — ACETAMINOPHEN 325 MG PO TABS: 650.0000 mg | ORAL_TABLET | Freq: Four times a day (QID) | ORAL | Status: DC | PRN

## 2020-10-20 MED ORDER — SODIUM CHLORIDE 0.9% IV SOLUTION: Freq: Once | INTRAVENOUS | Status: DC

## 2020-10-20 MED ORDER — DEXTROSE 5 % IV SOLN: INTRAVENOUS | Status: DC

## 2020-10-20 MED ORDER — ACETAMINOPHEN 650 MG RE SUPP: 650.0000 mg | Freq: Four times a day (QID) | RECTAL | Status: DC | PRN

## 2020-10-20 MED ORDER — MIDAZOLAM BOLUS VIA INFUSION (WITHDRAWAL LIFE SUSTAINING TX)
2.0000 mg | INTRAVENOUS | Status: DC | PRN
  Filled 2020-10-20: qty 2

## 2020-10-20 MED ORDER — POLYVINYL ALCOHOL 1.4 % OP SOLN
1.0000 [drp] | Freq: Four times a day (QID) | OPHTHALMIC | Status: DC | PRN
  Filled 2020-10-20: qty 15

## 2020-10-20 MED ORDER — MIDAZOLAM 50MG/50ML (1MG/ML) PREMIX INFUSION
0.0000 mg/h | INTRAVENOUS | Status: DC
  Administered 2020-10-20: 8 mg/h via INTRAVENOUS
  Administered 2020-10-20: 1 mg/h via INTRAVENOUS
  Filled 2020-10-20 (×2): qty 50

## 2020-10-20 MED ORDER — DIPHENHYDRAMINE HCL 50 MG/ML IJ SOLN: 25.0000 mg | INTRAMUSCULAR | Status: DC | PRN

## 2020-10-20 NOTE — Progress Notes (Signed)
Updated family at bedside that pt has deteriorated from last week with increased secretions, tachypnea on sbt, fevers to 103 etc. He is now on 175 mcg neo. I expressed I do not think he will be successful off vent and per his previously expressed wishes we should allow him to pass peacefully.   Family has agreed to comfort measures. We will compassionately extubate and once pt's family is back by his side we will stop the vasopressors.   RN present for conversation and aware.  Orders placed

## 2020-10-20 NOTE — Procedures (Signed)
Extubation Procedure Note  Patient Details:   Name: MEARL OLVER DOB: [birth year] MRN: [id number]   Airway Documentation:    Vent end date: 11/05/2020 Vent end time: 1810   Evaluation  O2 sats: currently acceptable Complications: No apparent complications Patient did tolerate procedure well. Bilateral Breath Sounds: Rhonchi   Yes   Pt extubated to comfort care per physician order and family request. Pt suctioned via ETT and orally prior. Pt family returned to bedside. RT will continue to monitor and be available as needed.   Sharla Kidney 11/01/2020, 6:12 PM

## 2020-10-20 NOTE — Progress Notes (Addendum)
6010: Critical hemoglobin 6.5 received by this RN   0745: Shirlee Limerick CCM NP notified face-to-face. Will review chart and place orders.   1015: 1 unit blood d/ced per Dr. [name]. Repeat hemoglobin ordered STAT.  1125: Repeat hemoglobin remains 6.5. Dr. [name] made aware. No new orders.  Trudee Grip, RN

## 2020-10-20 NOTE — Progress Notes (Signed)
ANTICOAGULATION CONSULT NOTE  Pharmacy Consult:  Heparin Indication: CVA and Afib  Allergies  Allergen Reactions  . Doxycycline Monohydrate Itching    *was taking two antibiotic at same time   . Sulfamethoxazole-Trimethoprim Itching    *was taking antibiotics at same time.  Marland Kitchen Penicillin G Rash    Patient Measurements: Height: 5' 8.25" (173.4 cm) Weight: 86.9 kg (191 lb 9.3 oz) IBW/kg (Calculated) : 68.98  Heparin Dosing Weight: 74 kg  Vital Signs: Temp: 100.4 F (38 C) (06/04 1800) Temp Source: Rectal (06/04 1800) BP: 117/80 (06/04 2000) Pulse Rate: 97 (06/04 2000)  Labs: Recent Labs    10/17/20 0152 10/17/20 1809 10/18/20 0231 10/18/20 0933 10/18/20 1725 10/19/20 0243 10/19/20 1415 10/19/20 2348  HGB 8.1*  --  7.9*  --   --  8.1*  --   --   HCT 26.2*  --  25.0*  --   --  26.1*  --   --   PLT 310  --  347  --   --  337  --   --   HEPARINUNFRC 0.27*   < > 0.27*  --    < > 0.16* 0.15* 0.25*  CREATININE 5.06*  --   --  4.34*  --  3.99*  --   --    < > = values in this interval not displayed.    Estimated Creatinine Clearance: 17.2 mL/min (A) (by C-G formula based on SCr of 3.99 mg/dL (H)).   Assessment: 75 yo male with CVA likely due to Afib.  He also developed a new acute infarct on 5/24 CT.  Pharmacy consulted to dose heparin.  6/5 AM update:  Heparin level low  Goal of Therapy:  Heparin level 0.3-0.5 units/ml Monitor platelets by anticoagulation protocol: Yes   Plan:  -Inc heparin to 1750 units/hr -0900 heparin level  Narda Bonds, PharmD, BCPS Clinical Pharmacist Phone: [phone]
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
## 2020-10-22 LAB — TYPE AND SCREEN
ABO/RH(D): B POS
Antibody Screen: NEGATIVE
Unit division: 0

## 2020-10-22 LAB — BPAM RBC
Blood Product Expiration Date: 202206232359
ISSUE DATE / TIME: 202206051002
Unit Type and Rh: 7300

## 2020-11-15 NOTE — Progress Notes (Signed)
TOD verified by two nurses at 0257 AM    125 ml of morphine and 50 ml of versed wasted with Abby Bailiff into sink.
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
## 2020-11-15 DEATH — deceased

## 2020-11-19 ENCOUNTER — Ambulatory Visit: Payer: Medicare HMO | Admitting: Neurology
